# Patient Record
Sex: Female | Born: 1970 | Race: Black or African American | Hispanic: No | Marital: Married | Smoking: Never smoker
Health system: Southern US, Community
[De-identification: ages and names within clinical notes are randomized; demographics above are authoritative.]

## PROBLEM LIST (undated history)

## (undated) DIAGNOSIS — D649 Anemia, unspecified: Secondary | ICD-10-CM

## (undated) DIAGNOSIS — R112 Nausea with vomiting, unspecified: Secondary | ICD-10-CM

## (undated) DIAGNOSIS — Z9889 Other specified postprocedural states: Secondary | ICD-10-CM

## (undated) HISTORY — PX: TOOTH EXTRACTION: SUR596

## (undated) HISTORY — PX: ABDOMINAL HYSTERECTOMY: SHX81

## (undated) HISTORY — PX: TUBAL LIGATION: SHX77

---

## 2016-07-25 ENCOUNTER — Emergency Department (HOSPITAL_BASED_OUTPATIENT_CLINIC_OR_DEPARTMENT_OTHER)
Admission: EM | Admit: 2016-07-25 | Discharge: 2016-07-25 | Disposition: A | Discharge status: Home / Self Care | Payer: BC Managed Care – PPO | Attending: Emergency Medicine | Admitting: Emergency Medicine

## 2016-07-25 ENCOUNTER — Emergency Department (HOSPITAL_BASED_OUTPATIENT_CLINIC_OR_DEPARTMENT_OTHER): Payer: BC Managed Care – PPO

## 2016-07-25 ENCOUNTER — Encounter (HOSPITAL_BASED_OUTPATIENT_CLINIC_OR_DEPARTMENT_OTHER): Payer: Self-pay | Admitting: *Deleted

## 2016-07-25 DIAGNOSIS — M62838 Other muscle spasm: Secondary | ICD-10-CM | POA: Diagnosis not present

## 2016-07-25 DIAGNOSIS — M7918 Myalgia, other site: Secondary | ICD-10-CM

## 2016-07-25 DIAGNOSIS — M542 Cervicalgia: Secondary | ICD-10-CM | POA: Diagnosis present

## 2016-07-25 MED ORDER — MELOXICAM 15 MG PO TABS: 15.0000 mg | ORAL_TABLET | Freq: Every day | ORAL | 0 refills | Status: DC

## 2016-07-25 MED ORDER — METHOCARBAMOL 500 MG PO TABS: 500.0000 mg | ORAL_TABLET | Freq: Two times a day (BID) | ORAL | 0 refills | Status: DC

## 2016-07-25 NOTE — ED Triage Notes (Signed)
Pt c/o left shoulder pain and neck pan x 1 week , seen by PMD xray neg finished decadron today, no improvement

## 2016-07-25 NOTE — ED Provider Notes (Signed)
[facility] Provider Note   CSN: [id number] Arrival date & time: 07/25/16  2247  By signing my name below, I, Margit Banda, attest that this documentation has been prepared under the direction and in the presence of Lexis Potenza, Gwenyth Allegra, MD. Electronically Signed: Margit Banda, ED Scribe. 07/25/16. 11:09 PM.  History   Chief Complaint Chief Complaint  Patient presents with  . Neck Pain    HPI [name] is a 46 y.o. female who presents to the Emergency Department complaining of neck pain for the last three weeks. Associated weakness and soreness to touch noted. Pain radiates to her left shoulder and arm. She describes pain as tingling. She recently went to urgent care in [facility] and was given antibiotics for bursitis. She also had an XR which came back negative. Pt denies fever, nausea, and vomiting.   The history is provided by the patient. No language interpreter was used.    History reviewed. No pertinent past medical history.  There are no active problems to display for this patient.   Past Surgical History:  Procedure Laterality Date  . CESAREAN SECTION    . TUBAL LIGATION      OB History    No data available       Home Medications    Prior to Admission medications   Medication Sig Start Date End Date Taking? Authorizing Provider  meloxicam (MOBIC) 15 MG tablet Take 1 tablet (15 mg total) by mouth daily. 07/25/16   Orpah Greek, MD  methocarbamol (ROBAXIN) 500 MG tablet Take 1 tablet (500 mg total) by mouth 2 (two) times daily. 07/25/16   Orpah Greek, MD    Family History No family history on file.  Social History Social History  Substance Use Topics  . Smoking status: Never Smoker  . Smokeless tobacco: Not on file  . Alcohol use No     Allergies   Patient has no known allergies.   Review of Systems Review of Systems  Constitutional: Positive for fever.  Gastrointestinal: Negative for nausea and  vomiting.  Musculoskeletal: Positive for arthralgias and neck pain.  Neurological: Positive for weakness.  All other systems reviewed and are negative.    Physical Exam Updated Vital Signs BP 129/83   Pulse 73   Temp 98.6 F (37 C)   Resp 16   Ht 5' 5"  (1.651 m)   Wt 81.6 kg (180 lb)   LMP 06/25/2016 Comment: ncp  SpO2 100%   BMI 29.95 kg/m   Physical Exam  Constitutional: She is oriented to person, place, and time. She appears well-developed and well-nourished. No distress.  HENT:  Head: Normocephalic and atraumatic.  Right Ear: Hearing normal.  Left Ear: Hearing normal.  Nose: Nose normal.  Mouth/Throat: Oropharynx is clear and moist and mucous membranes are normal.  Eyes: Conjunctivae and EOM are normal. Pupils are equal, round, and reactive to light.  Neck: Normal range of motion. Neck supple.  Left paraspinal tenderness and spasms. Diffuse left shoulder tenderness.   Cardiovascular: Regular rhythm, S1 normal and S2 normal.  Exam reveals no gallop and no friction rub.   No murmur heard. Pulmonary/Chest: Effort normal and breath sounds normal. No respiratory distress. She exhibits no tenderness.  Abdominal: Soft. Normal appearance and bowel sounds are normal. There is no hepatosplenomegaly. There is no tenderness. There is no rebound, no guarding, no tenderness at McBurney's point and negative Murphy's sign. No hernia.  Musculoskeletal: Normal range of motion.  Neurological: She is alert  and oriented to person, place, and time. She has normal strength. No cranial nerve deficit or sensory deficit. Coordination normal. GCS eye subscore is 4. GCS verbal subscore is 5. GCS motor subscore is 6.  Very slightly decreased grip strength L verses R.  Skin: Skin is warm, dry and intact. No rash noted. No cyanosis.  Psychiatric: She has a normal mood and affect. Her speech is normal and behavior is normal. Thought content normal.  Nursing note and vitals reviewed.    ED Treatments  / Results  DIAGNOSTIC STUDIES: Oxygen Saturation is 100% on RA, normal by my interpretation.   COORDINATION OF CARE: 11:09 PM-Discussed next steps with pt. Pt verbalized understanding and is agreeable with the plan.   Labs (all labs ordered are listed, but only abnormal results are displayed) Labs Reviewed - No data to display  EKG  EKG Interpretation None       Radiology Ct Cervical Spine Wo Contrast  Result Date: 07/25/2016 CLINICAL DATA:  Subacute onset of neck, left shoulder and arm pain. Initial encounter. EXAM: CT CERVICAL SPINE WITHOUT CONTRAST TECHNIQUE: Multidetector CT imaging of the cervical spine was performed without intravenous contrast. Multiplanar CT image reconstructions were also generated. COMPARISON:  None. FINDINGS: Alignment: Normal. Skull base and vertebrae: No acute fracture. No primary bone lesion or focal pathologic process. Soft tissues and spinal canal: No prevertebral fluid or swelling. No visible canal hematoma. Disc levels: Intervertebral disc spaces are preserved. Scattered small anterior and posterior disc osteophyte complexes are seen at the lower cervical spine. Upper chest: The minimally visualized lung apices are clear. The thyroid gland is grossly unremarkable. Other: No additional soft tissue abnormalities are seen. The visualized portions of the brain are unremarkable. IMPRESSION: No fracture or subluxation along the cervical spine. Electronically Signed   By: Garald Balding M.D.   On: 07/25/2016 23:48    Procedures Procedures (including critical care time)  Medications Ordered in ED Medications - No data to display   Initial Impression / Assessment and Plan / ED Course  I have reviewed the triage vital signs and the nursing notes.  Pertinent labs & imaging results that were available during my care of the patient were reviewed by me and considered in my medical decision making (see chart for details).     Patient presents with  complaints of pain in the neck, left shoulder and left arm. She reports that it started 3 weeks ago and has progressively worsened. Patient reports some pain with moving her head from side to side, pain into the left shoulder and arm with tingling. She had very slight decreased grip strength, but likely painful inhibition rather than deficit. She has preserved sensation and motor function/strength across all 3 nerves in the hand. CT scan of cervical spine performed. No obvious abnormality noted. Will require anti-inflammatory medication, muscle relaxer, follow-up.  Final Clinical Impressions(s) / ED Diagnoses   Final diagnoses:  Muscle spasms of neck  Musculoskeletal pain    New Prescriptions New Prescriptions   MELOXICAM (MOBIC) 15 MG TABLET    Take 1 tablet (15 mg total) by mouth daily.   METHOCARBAMOL (ROBAXIN) 500 MG TABLET    Take 1 tablet (500 mg total) by mouth 2 (two) times daily.     Orpah Greek, MD 07/25/16 [phone]

## 2016-07-30 ENCOUNTER — Ambulatory Visit: Payer: BC Managed Care – PPO | Admitting: Family Medicine

## 2016-08-01 ENCOUNTER — Encounter: Payer: Self-pay | Admitting: Family Medicine

## 2016-08-01 ENCOUNTER — Ambulatory Visit (INDEPENDENT_AMBULATORY_CARE_PROVIDER_SITE_OTHER): Payer: BC Managed Care – PPO | Admitting: Family Medicine

## 2016-08-01 DIAGNOSIS — M5010 Cervical disc disorder with radiculopathy, unspecified cervical region: Secondary | ICD-10-CM | POA: Diagnosis not present

## 2016-08-01 MED ORDER — PREDNISONE 10 MG PO TABS: ORAL_TABLET | ORAL | 0 refills | Status: DC

## 2016-08-01 MED ORDER — HYDROCODONE-ACETAMINOPHEN 5-325 MG PO TABS: 1.0000 | ORAL_TABLET | Freq: Four times a day (QID) | ORAL | 0 refills | Status: DC | PRN

## 2016-08-01 NOTE — Patient Instructions (Signed)
You have cervical radiculopathy (a pinched nerve in the neck). Prednisone 12 day dose pack to relieve irritation/inflammation of the nerve. Aleve 2 tabs twice a day with food for pain and inflammation - start day AFTER finishing prednisone. Norco as needed for severe pain (no driving on this medicine). Consider cervical collar if severely painful. Simple range of motion exercises within limits of pain to prevent further stiffness. Consider physical therapy for stretching, exercises, traction, and modalities. Heat 15 minutes at a time 3-4 times a day to help with spasms. Watch head position when on computers, texting, when sleeping in bed - should in line with back to prevent further nerve traction and irritation. Consider home traction unit if you get benefit with this in physical therapy. We will get approval for an MRI and can tentatively set this up for after the 29th but if you're doing well you should cancel this.

## 2016-08-06 DIAGNOSIS — M5010 Cervical disc disorder with radiculopathy, unspecified cervical region: Secondary | ICD-10-CM | POA: Insufficient documentation

## 2016-08-06 NOTE — Progress Notes (Addendum)
PCP: Patient, No Pcp Per  Subjective:   HPI: Patient is a 46 y.o. female here for neck, left shoulder pain.  Patient reports for about 3 weeks she's had pain in left side of neck and shoulder. Was achy in shoulder before started radiating down into the arm. Worse with typing - sits at computer all day. Into all fingers. Pain at neck 2/10, up to 4/10 in shoulder area and sharp now. Tried mobic, robaxin, a steroid for 6-7 days. Especially worse the past week. Associated numbness. Arm feels weak. No skin changes.  No past medical history on file.  Current Outpatient Prescriptions on File Prior to Visit  Medication Sig Dispense Refill  . meloxicam (MOBIC) 15 MG tablet Take 1 tablet (15 mg total) by mouth daily. 20 tablet 0  . methocarbamol (ROBAXIN) 500 MG tablet Take 1 tablet (500 mg total) by mouth 2 (two) times daily. 20 tablet 0   No current facility-administered medications on file prior to visit.     Past Surgical History:  Procedure Laterality Date  . CESAREAN SECTION    . TUBAL LIGATION      No Known Allergies  Social History   Social History  . Marital status: Married    Spouse name: N/A  . Number of children: N/A  . Years of education: N/A   Occupational History  . Not on file.   Social History Main Topics  . Smoking status: Never Smoker  . Smokeless tobacco: Never Used  . Alcohol use No  . Drug use: No  . Sexual activity: Not on file   Other Topics Concern  . Not on file   Social History Narrative  . No narrative on file    No family history on file.  BP 134/76   Pulse 78   Ht 5' 5"  (1.651 m)   Wt 182 lb (82.6 kg)   BMI 30.29 kg/m   Review of Systems: See HPI above.     Objective:  Physical Exam:  Gen: NAD, comfortable in exam room  Neck: No gross deformity, swelling, bruising. TTP left cervical paraspinal region.  No midline/bony TTP. FROM neck - pain right lateral rotation. BUE strength 5/5.   Sensation intact to light touch  currently 1+ equal reflexes in triceps, biceps, brachioradialis tendons. Negative spurlings.  Left shoulder: No swelling, ecchymoses.  No gross deformity. No TTP. FROM. Negative Hawkins, Neers. Negative Yergasons. Strength 5/5 with empty can and resisted internal/external rotation. Negative apprehension. NV intact distally.  Right shoulder: FROM without pain.   Assessment & Plan:  1. Cervical radiculopathy - Patient unfortunately not improving to date with 6 day course of prednisone, mobic, robaxin.  Will set up MRI to assess.  Prednisone 12 day dose pack, norco if needed for severe pain in meantime.  Discussed ergonomic issues.    Addendum:  MRI reviewed and discussed with patient.  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  Moderate stenosis on left at C5-6.  Discussed options - she has improved some since last visit.  She would like to start physical therapy.  Consider ESI if not improving.  F/u in 5-6 weeks if doing well.

## 2016-08-06 NOTE — Assessment & Plan Note (Signed)
Patient unfortunately not improving to date with 6 day course of prednisone, mobic, robaxin.  Will set up MRI to assess.  Prednisone 12 day dose pack, norco if needed for severe pain in meantime.  Discussed ergonomic issues.

## 2016-08-07 NOTE — Addendum Note (Signed)
Addended by: Sherrie George F on: 08/07/2016 09:08 AM   Modules accepted: Orders

## 2016-08-14 ENCOUNTER — Other Ambulatory Visit: Payer: Self-pay | Admitting: Family Medicine

## 2016-09-26 ENCOUNTER — Encounter: Payer: Self-pay | Admitting: Family Medicine

## 2017-04-25 ENCOUNTER — Other Ambulatory Visit: Payer: Self-pay | Admitting: Obstetrics and Gynecology

## 2017-04-25 ENCOUNTER — Other Ambulatory Visit (HOSPITAL_COMMUNITY)
Admission: RE | Admit: 2017-04-25 | Discharge: 2017-04-25 | Disposition: A | Discharge status: Home / Self Care | Payer: BC Managed Care – PPO | Source: Ambulatory Visit | Attending: Obstetrics and Gynecology | Admitting: Obstetrics and Gynecology

## 2017-04-25 DIAGNOSIS — Z01411 Encounter for gynecological examination (general) (routine) with abnormal findings: Secondary | ICD-10-CM | POA: Insufficient documentation

## 2017-04-25 DIAGNOSIS — Z1231 Encounter for screening mammogram for malignant neoplasm of breast: Secondary | ICD-10-CM

## 2017-04-29 LAB — CYTOLOGY - PAP
DIAGNOSIS: NEGATIVE
HPV (WINDOPATH): NOT DETECTED

## 2017-05-27 ENCOUNTER — Ambulatory Visit
Admission: RE | Admit: 2017-05-27 | Discharge: 2017-05-27 | Disposition: A | Discharge status: Home / Self Care | Payer: BC Managed Care – PPO | Source: Ambulatory Visit | Attending: Obstetrics and Gynecology | Admitting: Obstetrics and Gynecology

## 2017-05-27 DIAGNOSIS — Z1231 Encounter for screening mammogram for malignant neoplasm of breast: Secondary | ICD-10-CM

## 2017-05-28 ENCOUNTER — Other Ambulatory Visit: Payer: Self-pay | Admitting: Obstetrics and Gynecology

## 2017-05-28 DIAGNOSIS — R928 Other abnormal and inconclusive findings on diagnostic imaging of breast: Secondary | ICD-10-CM

## 2017-05-31 ENCOUNTER — Ambulatory Visit: Payer: BC Managed Care – PPO

## 2017-05-31 ENCOUNTER — Ambulatory Visit
Admission: RE | Admit: 2017-05-31 | Discharge: 2017-05-31 | Disposition: A | Discharge status: Home / Self Care | Payer: BC Managed Care – PPO | Source: Ambulatory Visit | Attending: Obstetrics and Gynecology | Admitting: Obstetrics and Gynecology

## 2017-05-31 DIAGNOSIS — R928 Other abnormal and inconclusive findings on diagnostic imaging of breast: Secondary | ICD-10-CM

## 2018-01-15 NOTE — Patient Instructions (Addendum)
Your procedure is scheduled on: Wednesday, 12/18  Enter through the Main Entrance of [facility] at: 7 am  Pick up the phone at the desk and dial [phone].  Call this number if you have problems the morning of surgery: [phone].  Remember: Do NOT eat food or Do NOT drink clear liquids (including water) after midnight Tuesday.  Take these medicines the morning of surgery with a SIP OF WATER:  None  Brush your teeth on the day of surgery.  Stop herbal medications, vitamin supplements, Ibuprofen/NSAIDS at this time.  Do NOT wear jewelry (body piercing), metal hair clips/bobby pins, make-up, or nail polish. Do NOT wear lotions, powders, or perfumes.  You may wear deoderant. Do NOT shave for 48 hours prior to surgery. Do NOT bring valuables to the hospital. Contacts may not be worn into surgery.  Leave suitcase in car.  After surgery it may be brought to your room.  For patients admitted to the hospital, checkout time is 11:00 AM the day of discharge. Have a responsible adult drive you home and stay with you for 24 hours after your procedure.  Home with Husband [name] cell [phone].

## 2018-01-24 ENCOUNTER — Other Ambulatory Visit: Payer: Self-pay

## 2018-01-24 ENCOUNTER — Encounter (HOSPITAL_COMMUNITY): Payer: Self-pay

## 2018-01-24 ENCOUNTER — Encounter (HOSPITAL_COMMUNITY)
Admission: RE | Admit: 2018-01-24 | Discharge: 2018-01-24 | Disposition: A | Discharge status: Home / Self Care | Payer: BC Managed Care – PPO | Source: Ambulatory Visit | Attending: Obstetrics and Gynecology | Admitting: Obstetrics and Gynecology

## 2018-01-24 DIAGNOSIS — Z01812 Encounter for preprocedural laboratory examination: Secondary | ICD-10-CM | POA: Insufficient documentation

## 2018-01-24 HISTORY — DX: Anemia, unspecified: D64.9

## 2018-01-24 HISTORY — DX: Other specified postprocedural states: Z98.890

## 2018-01-24 HISTORY — DX: Other specified postprocedural states: R11.2

## 2018-01-24 LAB — TYPE AND SCREEN
ABO/RH(D): B POS
Antibody Screen: NEGATIVE

## 2018-01-24 LAB — ABO/RH: ABO/RH(D): B POS

## 2018-01-24 LAB — CBC
HCT: 32 % — ABNORMAL LOW (ref 36.0–46.0)
HEMOGLOBIN: 10.2 g/dL — AB (ref 12.0–15.0)
MCH: 26.9 pg (ref 26.0–34.0)
MCHC: 31.9 g/dL (ref 30.0–36.0)
MCV: 84.4 fL (ref 80.0–100.0)
Platelets: 321 10*3/uL (ref 150–400)
RBC: 3.79 MIL/uL — AB (ref 3.87–5.11)
RDW: 18.3 % — ABNORMAL HIGH (ref 11.5–15.5)
WBC: 7.6 10*3/uL (ref 4.0–10.5)
nRBC: 0 % (ref 0.0–0.2)

## 2018-01-28 ENCOUNTER — Encounter (HOSPITAL_COMMUNITY): Payer: Self-pay | Admitting: Anesthesiology

## 2018-01-28 NOTE — Anesthesia Preprocedure Evaluation (Addendum)
Anesthesia Evaluation  Patient identified by MRN, date of birth, ID band Patient awake    Reviewed: Allergy & Precautions, NPO status , Patient's Chart, lab work & pertinent test results  History of Anesthesia Complications (+) PONV and history of anesthetic complications  Airway Mallampati: II  TM Distance: >3 FB Neck ROM: Full    Dental no notable dental hx. (+) Teeth Intact   Pulmonary neg pulmonary ROS,    Pulmonary exam normal breath sounds clear to auscultation       Cardiovascular negative cardio ROS Normal cardiovascular exam Rhythm:Regular Rate:Normal     Neuro/Psych  Neuromuscular disease negative psych ROS   GI/Hepatic negative GI ROS, Neg liver ROS,   Endo/Other  negative endocrine ROS  Renal/GU negative Renal ROS  negative genitourinary   Musculoskeletal DDD cervical spine    Abdominal   Peds  Hematology  (+) anemia ,   Anesthesia Other Findings   Reproductive/Obstetrics Uterine fibroids                            Anesthesia Physical Anesthesia Plan  ASA: II  Anesthesia Plan: General   Post-op Pain Management:    Induction: Intravenous  PONV Risk Score and Plan: 4 or greater and Ondansetron, Treatment may vary due to age or medical condition, Scopolamine patch - Pre-op, Midazolam and Dexamethasone  Airway Management Planned: Oral ETT  Additional Equipment:   Intra-op Plan:   Post-operative Plan: Extubation in OR  Informed Consent: I have reviewed the patients History and Physical, chart, labs and discussed the procedure including the risks, benefits and alternatives for the proposed anesthesia with the patient or authorized representative who has indicated his/her understanding and acceptance.   Dental advisory given  Plan Discussed with: CRNA and Surgeon  Anesthesia Plan Comments:        Anesthesia Quick Evaluation

## 2018-01-29 ENCOUNTER — Inpatient Hospital Stay (HOSPITAL_COMMUNITY)
Admission: RE | Admit: 2018-01-29 | Discharge: 2018-01-31 | DRG: 743 | Disposition: A | Discharge status: Home / Self Care | Payer: BC Managed Care – PPO | Attending: Obstetrics and Gynecology | Admitting: Obstetrics and Gynecology

## 2018-01-29 ENCOUNTER — Inpatient Hospital Stay (HOSPITAL_COMMUNITY): Payer: BC Managed Care – PPO | Admitting: Anesthesiology

## 2018-01-29 ENCOUNTER — Encounter (HOSPITAL_COMMUNITY): Payer: Self-pay

## 2018-01-29 ENCOUNTER — Other Ambulatory Visit: Payer: Self-pay

## 2018-01-29 ENCOUNTER — Encounter (HOSPITAL_COMMUNITY)
Admission: RE | Disposition: A | Discharge status: Home / Self Care | Payer: Self-pay | Source: Home / Self Care | Attending: Obstetrics and Gynecology

## 2018-01-29 DIAGNOSIS — Z9079 Acquired absence of other genital organ(s): Secondary | ICD-10-CM

## 2018-01-29 DIAGNOSIS — D259 Leiomyoma of uterus, unspecified: Secondary | ICD-10-CM | POA: Diagnosis present

## 2018-01-29 DIAGNOSIS — Z90722 Acquired absence of ovaries, bilateral: Secondary | ICD-10-CM

## 2018-01-29 DIAGNOSIS — N920 Excessive and frequent menstruation with regular cycle: Secondary | ICD-10-CM | POA: Diagnosis present

## 2018-01-29 DIAGNOSIS — D509 Iron deficiency anemia, unspecified: Secondary | ICD-10-CM | POA: Diagnosis present

## 2018-01-29 DIAGNOSIS — D219 Benign neoplasm of connective and other soft tissue, unspecified: Secondary | ICD-10-CM | POA: Diagnosis present

## 2018-01-29 DIAGNOSIS — Z90710 Acquired absence of both cervix and uterus: Secondary | ICD-10-CM

## 2018-01-29 HISTORY — PX: CYSTOSCOPY: SHX5120

## 2018-01-29 HISTORY — PX: LYSIS OF ADHESION: SHX5961

## 2018-01-29 HISTORY — PX: HYSTERECTOMY ABDOMINAL WITH SALPINGECTOMY: SHX6725

## 2018-01-29 LAB — PREGNANCY, URINE: Preg Test, Ur: NEGATIVE

## 2018-01-29 SURGERY — HYSTERECTOMY, TOTAL, ABDOMINAL, WITH SALPINGECTOMY
Anesthesia: General | Site: Bladder

## 2018-01-29 MED ORDER — MENTHOL 3 MG MT LOZG: 1.0000 | LOZENGE | OROMUCOSAL | Status: DC | PRN

## 2018-01-29 MED ORDER — SODIUM CHLORIDE (PF) 0.9 % IJ SOLN
INTRAMUSCULAR | Status: AC
  Filled 2018-01-29: qty 50

## 2018-01-29 MED ORDER — PROPOFOL 10 MG/ML IV BOLUS
INTRAVENOUS | Status: AC
  Filled 2018-01-29: qty 20

## 2018-01-29 MED ORDER — PROPOFOL 10 MG/ML IV BOLUS
INTRAVENOUS | Status: DC | PRN
  Administered 2018-01-29: 180 mg via INTRAVENOUS

## 2018-01-29 MED ORDER — METHYLENE BLUE 0.5 % INJ SOLN
INTRAVENOUS | Status: DC | PRN
  Administered 2018-01-29: 2 mL via INTRAVENOUS

## 2018-01-29 MED ORDER — BUPIVACAINE HCL (PF) 0.25 % IJ SOLN
INTRAMUSCULAR | Status: AC
  Filled 2018-01-29: qty 30

## 2018-01-29 MED ORDER — MEPERIDINE HCL 25 MG/ML IJ SOLN: 6.2500 mg | INTRAMUSCULAR | Status: DC | PRN

## 2018-01-29 MED ORDER — KETAMINE HCL 10 MG/ML IJ SOLN
INTRAMUSCULAR | Status: DC | PRN
  Administered 2018-01-29: 40 mg via INTRAVENOUS

## 2018-01-29 MED ORDER — CEFAZOLIN SODIUM-DEXTROSE 2-4 GM/100ML-% IV SOLN
INTRAVENOUS | Status: AC
  Filled 2018-01-29: qty 100

## 2018-01-29 MED ORDER — ROCURONIUM BROMIDE 100 MG/10ML IV SOLN
INTRAVENOUS | Status: AC
  Filled 2018-01-29: qty 1

## 2018-01-29 MED ORDER — LIDOCAINE HCL (CARDIAC) PF 100 MG/5ML IV SOSY
PREFILLED_SYRINGE | INTRAVENOUS | Status: DC | PRN
  Administered 2018-01-29: 80 mg via INTRAVENOUS

## 2018-01-29 MED ORDER — DEXAMETHASONE SODIUM PHOSPHATE 4 MG/ML IJ SOLN
INTRAMUSCULAR | Status: DC | PRN
  Administered 2018-01-29: 8 mg via INTRAVENOUS

## 2018-01-29 MED ORDER — METOCLOPRAMIDE HCL 5 MG/ML IJ SOLN
10.0000 mg | Freq: Once | INTRAMUSCULAR | Status: AC | PRN
  Administered 2018-01-29: 10 mg via INTRAVENOUS

## 2018-01-29 MED ORDER — ONDANSETRON HCL 4 MG/2ML IJ SOLN
INTRAMUSCULAR | Status: DC | PRN
  Administered 2018-01-29: 4 mg via INTRAVENOUS

## 2018-01-29 MED ORDER — VASOPRESSIN 20 UNIT/ML IV SOLN
INTRAVENOUS | Status: AC
  Filled 2018-01-29: qty 1

## 2018-01-29 MED ORDER — ROCURONIUM BROMIDE 100 MG/10ML IV SOLN
INTRAVENOUS | Status: DC | PRN
  Administered 2018-01-29: 10 mg via INTRAVENOUS
  Administered 2018-01-29: 50 mg via INTRAVENOUS
  Administered 2018-01-29: 20 mg via INTRAVENOUS

## 2018-01-29 MED ORDER — BUPIVACAINE LIPOSOME 1.3 % IJ SUSP
20.0000 mL | Freq: Once | INTRAMUSCULAR | Status: DC
  Filled 2018-01-29: qty 20

## 2018-01-29 MED ORDER — HYDROMORPHONE HCL 1 MG/ML IJ SOLN
INTRAMUSCULAR | Status: AC
  Filled 2018-01-29: qty 0.5

## 2018-01-29 MED ORDER — SODIUM CHLORIDE 0.9 % IV SOLN
INTRAVENOUS | Status: DC | PRN
  Administered 2018-01-29: 25 ug/min via INTRAVENOUS

## 2018-01-29 MED ORDER — HYDROMORPHONE HCL 1 MG/ML IJ SOLN
0.2500 mg | INTRAMUSCULAR | Status: DC | PRN
  Administered 2018-01-29 (×4): 0.5 mg via INTRAVENOUS

## 2018-01-29 MED ORDER — CIPROFLOXACIN HCL 250 MG PO TABS
250.0000 mg | ORAL_TABLET | Freq: Two times a day (BID) | ORAL | Status: AC
  Administered 2018-01-29 – 2018-01-30 (×2): 250 mg via ORAL
  Filled 2018-01-29 (×2): qty 1

## 2018-01-29 MED ORDER — ONDANSETRON HCL 4 MG PO TABS: 4.0000 mg | ORAL_TABLET | Freq: Four times a day (QID) | ORAL | Status: DC | PRN

## 2018-01-29 MED ORDER — KETOROLAC TROMETHAMINE 30 MG/ML IJ SOLN
INTRAMUSCULAR | Status: DC | PRN
  Administered 2018-01-29: 30 mg via INTRAVENOUS

## 2018-01-29 MED ORDER — KETOROLAC TROMETHAMINE 30 MG/ML IJ SOLN
30.0000 mg | Freq: Four times a day (QID) | INTRAMUSCULAR | Status: AC
  Administered 2018-01-29 – 2018-01-30 (×4): 30 mg via INTRAVENOUS
  Filled 2018-01-29 (×4): qty 1

## 2018-01-29 MED ORDER — DOCUSATE SODIUM 100 MG PO CAPS
100.0000 mg | ORAL_CAPSULE | Freq: Two times a day (BID) | ORAL | Status: DC
  Administered 2018-01-30 – 2018-01-31 (×4): 100 mg via ORAL
  Filled 2018-01-29 (×5): qty 1

## 2018-01-29 MED ORDER — POLYSACCHARIDE IRON COMPLEX 150 MG PO CAPS
150.0000 mg | ORAL_CAPSULE | Freq: Every day | ORAL | Status: DC
  Administered 2018-01-30 – 2018-01-31 (×2): 150 mg via ORAL
  Filled 2018-01-29 (×3): qty 1

## 2018-01-29 MED ORDER — ONDANSETRON HCL 4 MG/2ML IJ SOLN
INTRAMUSCULAR | Status: AC
  Filled 2018-01-29: qty 2

## 2018-01-29 MED ORDER — EPHEDRINE SULFATE-NACL 50-0.9 MG/10ML-% IV SOSY
PREFILLED_SYRINGE | INTRAVENOUS | Status: DC | PRN
  Administered 2018-01-29 (×2): 5 mg via INTRAVENOUS
  Administered 2018-01-29: 10 mg via INTRAVENOUS
  Administered 2018-01-29: 5 mg via INTRAVENOUS

## 2018-01-29 MED ORDER — BUPIVACAINE HCL (PF) 0.25 % IJ SOLN
INTRAMUSCULAR | Status: AC
  Filled 2018-01-29: qty 10

## 2018-01-29 MED ORDER — KETOROLAC TROMETHAMINE 30 MG/ML IJ SOLN
INTRAMUSCULAR | Status: AC
  Filled 2018-01-29: qty 1

## 2018-01-29 MED ORDER — ONDANSETRON HCL 4 MG/2ML IJ SOLN
4.0000 mg | Freq: Four times a day (QID) | INTRAMUSCULAR | Status: DC | PRN
  Administered 2018-01-30: 4 mg via INTRAVENOUS
  Filled 2018-01-29: qty 2

## 2018-01-29 MED ORDER — OXYCODONE HCL 5 MG PO TABS
5.0000 mg | ORAL_TABLET | ORAL | Status: DC | PRN
  Administered 2018-01-30: 5 mg via ORAL
  Administered 2018-01-30: 10 mg via ORAL
  Administered 2018-01-30 – 2018-01-31 (×2): 5 mg via ORAL
  Filled 2018-01-29: qty 2
  Filled 2018-01-29: qty 1
  Filled 2018-01-29: qty 2
  Filled 2018-01-29 (×2): qty 1

## 2018-01-29 MED ORDER — CEFAZOLIN SODIUM-DEXTROSE 2-4 GM/100ML-% IV SOLN
2.0000 g | INTRAVENOUS | Status: AC
  Administered 2018-01-29: 2 g via INTRAVENOUS

## 2018-01-29 MED ORDER — LACTATED RINGERS IV SOLN
INTRAVENOUS | Status: DC
  Administered 2018-01-29: 09:00:00 via INTRAVENOUS
  Administered 2018-01-29: 125 mL/h via INTRAVENOUS
  Administered 2018-01-29: 11:00:00 via INTRAVENOUS

## 2018-01-29 MED ORDER — MIDAZOLAM HCL 2 MG/2ML IJ SOLN
INTRAMUSCULAR | Status: AC
  Filled 2018-01-29: qty 2

## 2018-01-29 MED ORDER — SUGAMMADEX SODIUM 200 MG/2ML IV SOLN
INTRAVENOUS | Status: DC | PRN
  Administered 2018-01-29: 160 mg via INTRAVENOUS

## 2018-01-29 MED ORDER — HYDROMORPHONE HCL 1 MG/ML IJ SOLN
0.2000 mg | INTRAMUSCULAR | Status: DC | PRN
  Administered 2018-01-29 (×3): 0.6 mg via INTRAVENOUS
  Filled 2018-01-29 (×3): qty 1

## 2018-01-29 MED ORDER — ACETAMINOPHEN 10 MG/ML IV SOLN
1000.0000 mg | Freq: Once | INTRAVENOUS | Status: AC
  Administered 2018-01-29: 1000 mg via INTRAVENOUS

## 2018-01-29 MED ORDER — FENTANYL CITRATE (PF) 250 MCG/5ML IJ SOLN
INTRAMUSCULAR | Status: AC
  Filled 2018-01-29: qty 5

## 2018-01-29 MED ORDER — SCOPOLAMINE 1 MG/3DAYS TD PT72
1.0000 | MEDICATED_PATCH | Freq: Once | TRANSDERMAL | Status: DC
  Administered 2018-01-29: 1.5 mg via TRANSDERMAL

## 2018-01-29 MED ORDER — FENTANYL CITRATE (PF) 250 MCG/5ML IJ SOLN
INTRAMUSCULAR | Status: DC | PRN
  Administered 2018-01-29: 50 ug via INTRAVENOUS
  Administered 2018-01-29: 100 ug via INTRAVENOUS
  Administered 2018-01-29 (×2): 50 ug via INTRAVENOUS

## 2018-01-29 MED ORDER — SUGAMMADEX SODIUM 200 MG/2ML IV SOLN
INTRAVENOUS | Status: AC
  Filled 2018-01-29: qty 2

## 2018-01-29 MED ORDER — ACETAMINOPHEN 500 MG PO TABS
1000.0000 mg | ORAL_TABLET | Freq: Four times a day (QID) | ORAL | Status: DC
  Administered 2018-01-29 – 2018-01-30 (×3): 1000 mg via ORAL
  Filled 2018-01-29 (×4): qty 2

## 2018-01-29 MED ORDER — LIDOCAINE HCL (CARDIAC) PF 100 MG/5ML IV SOSY
PREFILLED_SYRINGE | INTRAVENOUS | Status: AC
  Filled 2018-01-29: qty 5

## 2018-01-29 MED ORDER — EPHEDRINE 5 MG/ML INJ
INTRAVENOUS | Status: AC
  Filled 2018-01-29: qty 10

## 2018-01-29 MED ORDER — MIDAZOLAM HCL 2 MG/2ML IJ SOLN
INTRAMUSCULAR | Status: DC | PRN
  Administered 2018-01-29: 2 mg via INTRAVENOUS

## 2018-01-29 MED ORDER — BUPIVACAINE LIPOSOME 1.3 % IJ SUSP
INTRAMUSCULAR | Status: DC | PRN
  Administered 2018-01-29: 20 mL

## 2018-01-29 MED ORDER — KETAMINE HCL 10 MG/ML IJ SOLN
INTRAMUSCULAR | Status: AC
  Filled 2018-01-29: qty 1

## 2018-01-29 MED ORDER — INTEGRA 62.5-62.5-40-3 MG PO CAPS: 1.0000 | ORAL_CAPSULE | Freq: Every day | ORAL | Status: DC

## 2018-01-29 MED ORDER — LACTATED RINGERS IV SOLN
INTRAVENOUS | Status: DC
  Administered 2018-01-29 – 2018-01-30 (×3): via INTRAVENOUS

## 2018-01-29 MED ORDER — LIDOCAINE 2% (20 MG/ML) 5 ML SYRINGE
INTRAMUSCULAR | Status: DC | PRN
  Administered 2018-01-29: 10:00:00 via INTRAVENOUS
  Administered 2018-01-29: 1.5 mg/kg/h via INTRAVENOUS

## 2018-01-29 MED ORDER — METHYLENE BLUE 0.5 % INJ SOLN
INTRAVENOUS | Status: AC
  Filled 2018-01-29: qty 10

## 2018-01-29 MED ORDER — HYDROMORPHONE HCL 1 MG/ML IJ SOLN
INTRAMUSCULAR | Status: AC
  Filled 2018-01-29: qty 1

## 2018-01-29 MED ORDER — METOCLOPRAMIDE HCL 5 MG/ML IJ SOLN
INTRAMUSCULAR | Status: AC
  Filled 2018-01-29: qty 2

## 2018-01-29 MED ORDER — SIMETHICONE 80 MG PO CHEW
80.0000 mg | CHEWABLE_TABLET | Freq: Four times a day (QID) | ORAL | Status: DC | PRN
  Administered 2018-01-30 – 2018-01-31 (×4): 80 mg via ORAL
  Filled 2018-01-29 (×3): qty 1

## 2018-01-29 MED ORDER — DEXAMETHASONE SODIUM PHOSPHATE 10 MG/ML IJ SOLN
INTRAMUSCULAR | Status: AC
  Filled 2018-01-29: qty 1

## 2018-01-29 MED ORDER — BUPIVACAINE HCL (PF) 0.25 % IJ SOLN
INTRAMUSCULAR | Status: DC | PRN
  Administered 2018-01-29: 40 mL

## 2018-01-29 MED ORDER — SCOPOLAMINE 1 MG/3DAYS TD PT72
MEDICATED_PATCH | TRANSDERMAL | Status: AC
  Administered 2018-01-29: 1.5 mg via TRANSDERMAL
  Filled 2018-01-29: qty 1

## 2018-01-29 MED ORDER — ACETAMINOPHEN 10 MG/ML IV SOLN
INTRAVENOUS | Status: AC
  Filled 2018-01-29: qty 100

## 2018-01-29 MED ORDER — VASOPRESSIN 20 UNIT/ML IV SOLN
INTRAVENOUS | Status: DC | PRN
  Administered 2018-01-29: 11 mL via INTRAMUSCULAR

## 2018-01-29 SURGICAL SUPPLY — 44 items
BENZOIN TINCTURE PRP APPL 2/3 (GAUZE/BANDAGES/DRESSINGS) ×5 IMPLANT
CANISTER SUCT 3000ML PPV (MISCELLANEOUS) ×5 IMPLANT
CELLS DAT CNTRL 66122 CELL SVR (MISCELLANEOUS) IMPLANT
CLOSURE WOUND 1/2 X4 (GAUZE/BANDAGES/DRESSINGS) ×1
CONT PATH 16OZ SNAP LID 3702 (MISCELLANEOUS) ×5 IMPLANT
DRAPE WARM FLUID 44X44 (DRAPE) IMPLANT
DRSG OPSITE POSTOP 4X10 (GAUZE/BANDAGES/DRESSINGS) ×5 IMPLANT
DURAPREP 26ML APPLICATOR (WOUND CARE) ×5 IMPLANT
FORMULA 20CAL 3 OZ MEAD (FORMULA) ×5 IMPLANT
GAUZE 4X4 16PLY RFD (DISPOSABLE) ×5 IMPLANT
GLOVE BIO SURGEON STRL SZ7 (GLOVE) ×5 IMPLANT
GLOVE BIOGEL PI IND STRL 7.0 (GLOVE) ×9 IMPLANT
GLOVE BIOGEL PI INDICATOR 7.0 (GLOVE) ×6
GOWN STRL REUS W/TWL LRG LVL3 (GOWN DISPOSABLE) ×15 IMPLANT
HEMOSTAT ARISTA ABSORB 3G PWDR (MISCELLANEOUS) ×5 IMPLANT
HIBICLENS CHG 4% 4OZ BTL (MISCELLANEOUS) ×5 IMPLANT
NDL SAFETY ECLIPSE 18X1.5 (NEEDLE) ×3 IMPLANT
NEEDLE HYPO 18GX1.5 SHARP (NEEDLE) ×2
NEEDLE HYPO 22GX1.5 SAFETY (NEEDLE) ×10 IMPLANT
NS IRRIG 1000ML POUR BTL (IV SOLUTION) ×5 IMPLANT
PACK ABDOMINAL GYN (CUSTOM PROCEDURE TRAY) ×5 IMPLANT
PAD OB MATERNITY 4.3X12.25 (PERSONAL CARE ITEMS) ×5 IMPLANT
PENCIL SMOKE EVAC W/HOLSTER (ELECTROSURGICAL) ×5 IMPLANT
PROTECTOR NERVE ULNAR (MISCELLANEOUS) ×5 IMPLANT
RTRCTR C-SECT PINK 25CM LRG (MISCELLANEOUS) ×5 IMPLANT
RTRCTR WOUND ALEXIS 18CM MED (MISCELLANEOUS)
SET CYSTO W/LG BORE CLAMP LF (SET/KITS/TRAYS/PACK) ×10 IMPLANT
SPONGE LAP 18X18 RF (DISPOSABLE) ×20 IMPLANT
STRIP CLOSURE SKIN 1/2X4 (GAUZE/BANDAGES/DRESSINGS) ×4 IMPLANT
SUT MON AB 4-0 PS1 27 (SUTURE) ×5 IMPLANT
SUT PLAIN 2 0 XLH (SUTURE) ×5 IMPLANT
SUT VIC AB 0 CT1 18XCR BRD8 (SUTURE) ×9 IMPLANT
SUT VIC AB 0 CT1 27 (SUTURE) ×4
SUT VIC AB 0 CT1 27XBRD ANBCTR (SUTURE) ×6 IMPLANT
SUT VIC AB 0 CT1 27XCR 8 STRN (SUTURE) IMPLANT
SUT VIC AB 0 CT1 36 (SUTURE) ×5 IMPLANT
SUT VIC AB 0 CT1 8-18 (SUTURE) ×6
SUT VIC AB 2-0 CT1 27 (SUTURE) ×2
SUT VIC AB 2-0 CT1 TAPERPNT 27 (SUTURE) ×3 IMPLANT
SUT VICRYL 0 TIES 12 18 (SUTURE) ×5 IMPLANT
SYR CONTROL 10ML LL (SYRINGE) ×10 IMPLANT
SYRINGE 60CC LL (MISCELLANEOUS) ×5 IMPLANT
TOWEL OR 17X24 6PK STRL BLUE (TOWEL DISPOSABLE) ×10 IMPLANT
TRAY FOLEY W/BAG SLVR 14FR (SET/KITS/TRAYS/PACK) ×10 IMPLANT

## 2018-01-29 NOTE — Anesthesia Procedure Notes (Signed)
Procedure Name: Intubation Date/Time: 01/29/2018 8:35 AM Performed by: Raenette Rover, CRNA Pre-anesthesia Checklist: Patient identified, Emergency Drugs available, Suction available and Patient being monitored Patient Re-evaluated:Patient Re-evaluated prior to induction Oxygen Delivery Method: Circle system utilized Preoxygenation: Pre-oxygenation with 100% oxygen Induction Type: IV induction Ventilation: Mask ventilation without difficulty Laryngoscope Size: Mac and 3 Grade View: Grade I Tube type: Oral Tube size: 7.0 mm Number of attempts: 1 Airway Equipment and Method: Stylet Placement Confirmation: ETT inserted through vocal cords under direct vision,  positive ETCO2,  CO2 detector and breath sounds checked- equal and bilateral Secured at: 22 cm Tube secured with: Tape Dental Injury: Teeth and Oropharynx as per pre-operative assessment

## 2018-01-29 NOTE — Anesthesia Postprocedure Evaluation (Signed)
Anesthesia Post Note  Patient: [name]  Procedure(s) Performed: HYSTERECTOMY ABDOMINAL WITH SALPINGECTOMY (Bilateral Abdomen) CYSTOSCOPY (N/A Bladder)     Patient location during evaluation: PACU Anesthesia Type: General Level of consciousness: awake and alert and oriented Pain management: pain level controlled Vital Signs Assessment: post-procedure vital signs reviewed and stable Respiratory status: spontaneous breathing, nonlabored ventilation and respiratory function stable Cardiovascular status: blood pressure returned to baseline and stable Postop Assessment: no apparent nausea or vomiting Anesthetic complications: no    Last Vitals:  Vitals:   01/29/18 0708 01/29/18 1210  BP: (!) 153/93 (!) 150/81  Pulse: 67 72  Resp: 16 (!) 21  Temp: 36.8 C 37.3 C  SpO2: 100% 100%    Last Pain:  Vitals:   01/29/18 1210  TempSrc:   PainSc: Asleep   Pain Goal: Patients Stated Pain Goal: 3 (01/29/18 0708)               Aria Pickrell A.

## 2018-01-29 NOTE — H&P (Signed)
History of Present Illness  General:          47 y/o female presents to the clinic for pre-op of TAH.        US Pelvic (05/2017), x12 fibroids, largest 11 cm        Husband will be home to assist her after surgery.        Denies chest pain, SOB, and unusual discharge.         Pt previously had UTI in September.    Current Medications  TakingIntegra 62.5-62.5-40-3 MG Capsule 1 capsule Orally Once a day    Medication List reviewed and reconciled with the patient          Past Medical History       Medical History Verified..       Surgical History  Cesarean 1996  tubal ligation 2001      Family History  Father: alive  Mother: alive, diagnosed with Hypertension  Brother 1: alive  Sister 1: alive  1 brother(s) , 1 sister(s) - healthy.   denies any GYN family cancer hx.      Social History  General:         no Alcohol.        Children: 3.        Tobacco use            cigarettes:  Never smoked          Tobacco history last updated  01/14/2018          Vaping  No       Marital Status: married.        no Recreational drug use.        OCCUPATION: career Actor.        Exercise: NONE.     Gyn History  Sexual activity currently sexually active.   Periods : every month.   LMP 11/11//2019.   Birth control none.   Last pap smear date 04/25/2017 Neg/HPVneg.   Last mammogram date None.   Denies H/O Abnormal pap smear 2012 normal.   Denies H/O STD.      OB History  Number of pregnancies  5.   miscarriages  2.   Pregnancy # 1  C-section delivery.   Pregnancy # 2  C-section.   Pregnancy # 3  C-section.      Allergies  N.K.D.A.      Hospitalization/Major Diagnostic Procedure  None 04/2017      Review of Systems  See scanned ROS form for details. Denies fever/chills, chest pain, SOB, headaches, numbness/tingling. No h/o complication with anesthesia, bleeding disorders or blood clots.    Vital Signs  Wt 181.2, Wt change -3.6 lb, Ht 64.75, BMI 30.38, Temp  98.8, Pulse sitting 78, BP sitting 122/80.    Physical Examination  Chaperone present:          Chaperone present  Tyus,Brendell 01/14/2018 04:31:42 PM > Pt ok w/scribe and, for pelvic exam.   GENERAL:          Patient appears  alert and oriented.          General Appearance:  well-appearing, well-developed, no acute distress.          Speech:  clear.   LUNGS:          Auscultation:  no wheezing/rhonchi/rales. CTA bilaterally.   HEART:          Heart sounds:  normal. RRR. no murmur.   ABDOMEN:  General:  uterus palpated near umblicus, mobile, nontender.   FEMALE GENITOURINARY:          Pelvic  Not examined.   EXTREMITIES:          General:  No edema or calf tenderness.           Assessments  1. Encounter for other preprocedural examination - Z01.818 (Primary)  2. Iron deficiency anemia - D50.9  3. H/O urinary tract infection - Z87.440    Treatment  1. Encounter for other preprocedural examination   Notes: Pt counseled on R/B/A of TAH , including but not limited to infection, bleeding and injury to organs in the abdomen. Pt understands procedure, risks, and precautions. Reviewed increased risk of injury to bowel and bladder due to multiple abdominal surgeries. Pictures shown. Discussed post op recovery.      2. Iron deficiency anemia        LAB: CBC without Diff      LAB: Ferritin   3. H/O urinary tract infection        LAB: Culture if Positive (Urinalysis)      LAB: Urine Dip w/reflex to micro if positive   4. Others         LAB: Urine microscopic (ECL)      LAB: Culture, Urine Routine ([id number])      Procedures  Scribe Documentation:          Attestation:  I personally scribed for Dr. Simona Huh on the date of this appointment. Electronically signed by scribe , Anastasio Auerbach 01/14/2018 05:01:48 PM > .  Venipuncture:          Venipuncture:  Williams,Christina 01/14/2018 05:24:32 PM > performed in right arm.             Labs          Lab: Urine Dip  w/reflex to micro if positive       Specific Gravity 1.010  1.010-1.030 -        pH 7.0  5.0-8.0 -        Leukocyte Esterase 1+ A Negative -        Blood NEGATIVE  Negative - ERY/UL       Glucose NEGATIVE  Negative - mg/dL       Nitrite NEGATIVE  Negative -        Protein NEGATIVE  Negative - mg/dL       Ketones NEGATIVE  Negative - mg/dL       Urine Bilirubin NEGATIVE  Negative -        Urobilinogen 1.0  0.0-1.0 - mg/dL                Notes :Angelise Petrich B 01/16/2018 07:48:40 AM > Still anemic, not much improvment. Pt needs to take iron daily until surgery. Awaiting urine culture. Tyus,Brendell 01/17/2018 08:38:18 AM > Pt informed.                  Lab: Culture if Positive (Urinalysis)       CULTURE IF POS See Culture Report from [place]  -                 Notes :Eugine Bubb B 01/16/2018 07:48:40 AM > Still anemic, not much improvment. Pt needs to take iron daily until surgery. Awaiting urine culture. Tyus,Brendell 01/17/2018 08:38:18 AM > Pt informed.                  Lab: Ferritin   21.4  FERRITIN 21.4  11.0-306.8 - ng/mL                Notes :Renlee Floor B 01/16/2018 07:48:40 AM > Still anemic, not much improvment. Pt needs to take iron daily until surgery. Awaiting urine culture. Tyus,Brendell 01/17/2018 08:38:18 AM > Pt informed.                  Lab: CBC without Diff   Hg 9.3       WBC 9.7  4.0-11.0 - K/ul       RBC 3.50 L 4.20-5.40 - M/uL       HGB 9.3 L 12.0-16.0 - g/dL       HCT 28.8 L 37.0-47.0 - %       MCV 82.4  81.0-99.0 - fL       MCH 26.6 L 27.0-33.0 - pg       MCHC 32.2  32.0-36.0 - g/dL       RDW 17.4 H 11.5-15.5 - %       PLT 255  150-400 - K/uL                Notes :Kuper Rennels B 01/16/2018 07:48:40 AM > Still anemic, not much improvment. Pt needs to take iron daily until surgery. Awaiting urine culture. Tyus,Brendell 01/17/2018 08:38:18 AM > Pt informed.                  Lab: Urine microscopic (ECL)       RBC/hpf None Seen   0-5 - /HPF       WBC/hpf <5/HPF  0-5 - /HPF       Epithelial cells Few A 0-5 - /HPF       Mucus Negative  Negative - /HPF       Bacteria Abundant A Negative - /HPF                Notes :eclinicalworks, support 01/14/2018 06:01:00 : This order was created by the Interface. Shadaya Marschner B 01/16/2018 07:48:40 AM > Still anemic, not much improvment. Pt needs to take iron daily until surgery. Awaiting urine culture. Tyus,Brendell 01/17/2018 08:38:18 AM > Pt informed.                  Lab: Culture, Urine Routine ([id number])       Urine Culture, Routine Final report A -                 Notes :eclinicalworks, support 01/16/2018 01:43:00 : This order was created by the Interface. Tyus,Brendell 01/17/2018 08:38:18 AM > Pt informed.     Follow Up  2 Weeks post op

## 2018-01-29 NOTE — Plan of Care (Signed)
Patient admitted for post op care

## 2018-01-29 NOTE — Brief Op Note (Signed)
01/29/2018  12:15 PM  PATIENT:  [name]  47 y.o. female  PRE-OPERATIVE DIAGNOSIS:  D21.9 Fibroids, menorrhagia  POST-OPERATIVE DIAGNOSIS:  Same, dense adhesions   PROCEDURE:  Procedure(s): HYSTERECTOMY ABDOMINAL WITH SALPINGECTOMY (Bilateral) CYSTOSCOPY (N/A) LYSIS OF ADHESIONS  SURGEON:  Surgeon(s) and Role:    Thurnell Lose, MD - Primary    * Janyth Pupa, DO - Assisting  PHYSICIAN ASSISTANT: None  ASSISTANTS: Dr. Nelda Marseille, Technician   ANESTHESIA:   local and general  EBL:  350 mL   BLOOD ADMINISTERED:none  DRAINS: Urinary Catheter (Foley)   LOCAL MEDICATIONS USED:  OTHER Exparel, Marcaine 0.25% (Mixed, 40 ml total)  SPECIMEN:  Source of Specimen:  Uterus with cervix and bilateral fallopian tubes  DISPOSITION OF SPECIMEN:  PATHOLOGY  COUNTS:  YES  TOURNIQUET:  * No tourniquets in log *  DICTATION: .Other Dictation: Dictation Number [id number]  PLAN OF CARE: Admit to inpatient   PATIENT DISPOSITION:  PACU - hemodynamically stable.   Delay start of Pharmacological VTE agent (>24hrs) due to surgical blood loss or risk of bleeding: yes

## 2018-01-29 NOTE — OR Nursing (Signed)
Family updated on surgery progress per Dr. Simona Huh by phone

## 2018-01-29 NOTE — OR Nursing (Signed)
Family updated by phone. Dr. Simona Huh is closing, will do the cystoscopy and then be out to talk to family. Family will wait in surgical waiting room for Dr. Simona Huh

## 2018-01-29 NOTE — Transfer of Care (Signed)
Immediate Anesthesia Transfer of Care Note  Patient: [name]  Procedure(s) Performed: HYSTERECTOMY ABDOMINAL WITH SALPINGECTOMY (Bilateral Abdomen) CYSTOSCOPY (N/A Bladder)  Patient Location: PACU  Anesthesia Type:General  Level of Consciousness: drowsy and patient cooperative  Airway & Oxygen Therapy: Patient Spontanous Breathing and Patient connected to nasal cannula oxygen  Post-op Assessment: Report given to RN and Post -op Vital signs reviewed and stable  Post vital signs: Reviewed and stable  Last Vitals:  Vitals Value Taken Time  BP 150/81 01/29/2018 12:11 PM  Temp    Pulse 68 01/29/2018 12:14 PM  Resp 22 01/29/2018 12:14 PM  SpO2 100 % 01/29/2018 12:14 PM  Vitals shown include unvalidated device data.  Last Pain:  Vitals:   01/29/18 0708  TempSrc: Oral  PainSc: 0-No pain      Patients Stated Pain Goal: 3 (94/58/59 2924)  Complications: No apparent anesthesia complications

## 2018-01-29 NOTE — Interval H&P Note (Signed)
History and Physical Interval Note:  01/29/2018 8:19 AM  [name]  has presented today for surgery, with the diagnosis of D21.9 Fibroids  The various methods of treatment have been discussed with the patient and family. After consideration of risks, benefits and other options for treatment, the patient has consented to  Procedure(s): HYSTERECTOMY ABDOMINAL WITH SALPINGECTOMY (Bilateral) as a surgical intervention .  The patient's history has been reviewed, patient examined, no change in status, stable for surgery.  I have reviewed the patient's chart and labs.  Questions were answered to the patient's satisfaction.     Thurnell Lose

## 2018-01-30 ENCOUNTER — Encounter (HOSPITAL_COMMUNITY): Payer: Self-pay | Admitting: Obstetrics and Gynecology

## 2018-01-30 LAB — CBC
HEMATOCRIT: 29.4 % — AB (ref 36.0–46.0)
Hemoglobin: 9.6 g/dL — ABNORMAL LOW (ref 12.0–15.0)
MCH: 26.7 pg (ref 26.0–34.0)
MCHC: 32.7 g/dL (ref 30.0–36.0)
MCV: 81.9 fL (ref 80.0–100.0)
Platelets: 336 10*3/uL (ref 150–400)
RBC: 3.59 MIL/uL — ABNORMAL LOW (ref 3.87–5.11)
RDW: 17.9 % — ABNORMAL HIGH (ref 11.5–15.5)
WBC: 16.2 10*3/uL — ABNORMAL HIGH (ref 4.0–10.5)
nRBC: 0 % (ref 0.0–0.2)

## 2018-01-30 MED ORDER — KETOROLAC TROMETHAMINE 10 MG PO TABS
10.0000 mg | ORAL_TABLET | Freq: Four times a day (QID) | ORAL | Status: DC | PRN
  Filled 2018-01-30: qty 1

## 2018-01-30 MED ORDER — HYDROCHLOROTHIAZIDE 12.5 MG PO CAPS
12.5000 mg | ORAL_CAPSULE | Freq: Every day | ORAL | Status: DC
  Administered 2018-01-30 – 2018-01-31 (×2): 12.5 mg via ORAL
  Filled 2018-01-30 (×3): qty 1

## 2018-01-30 MED ORDER — ACETAMINOPHEN 325 MG PO TABS: 650.0000 mg | ORAL_TABLET | Freq: Four times a day (QID) | ORAL | Status: DC | PRN

## 2018-01-30 NOTE — Discharge Instructions (Signed)
Abdominal Hysterectomy, Care After This sheet gives you information about how to care for yourself after your procedure. Your doctor may also give you more specific instructions. If you have problems or questions, contact your doctor. Follow these instructions at home: Bathing  Do not take baths, swim, or use a hot tub until your doctor says it is okay. Ask your doctor if you can take showers. You may only be allowed to take sponge baths for bathing.  Keep the bandage (dressing) dry until your doctor says it can be taken off. Surgical cut (incision) care      Follow instructions from your doctor about how to take care of your cut from surgery. Make sure you: ? Wash your hands with soap and water before you change your bandage (dressing). If you cannot use soap and water, use hand sanitizer. ? Change your bandage as told by your doctor. ? Leave stitches (sutures), skin glue, or skin tape (adhesive) strips in place. They may need to stay in place for 2 weeks or longer. If tape strips get loose and curl up, you may trim the loose edges. Do not remove tape strips completely unless your doctor says it is okay.  Check your surgical cut area every day for signs of infection. Check for: ? Redness, swelling, or pain. ? Fluid or blood. ? Warmth. ? Pus or a bad smell. Activity  Do gentle, daily exercise as told by your doctor. You may be told to take short walks every day and go farther each time.  Do not lift anything that is heavier than 10 lb (4.5 kg), or the limit that your doctor tells you, until he or she says that it is safe.  Do not drive or use heavy machinery while taking prescription pain medicine.  Do not drive for 24 hours if you were given a medicine to help you relax (sedative).  Follow your doctor's advice about exercise, driving, and general activities. Ask your doctor what activities are safe for you. Lifestyle   Do not douche, use tampons, or have sex for at least 6 weeks  or as told by your doctor.  Do not drink alcohol until your doctor says it is okay.  Drink enough fluid to keep your pee (urine) clear or pale yellow.  Try to have someone at home with you for the first 1-2 weeks to help.  Do not use any products that contain nicotine or tobacco, such as cigarettes and e-cigarettes. These can slow down healing. If you need help quitting, ask your doctor. General instructions  Take over-the-counter and prescription medicines only as told by your doctor.  Do not take aspirin or ibuprofen. These medicines can cause bleeding.  To prevent or treat constipation while you are taking prescription pain medicine, your doctor may suggest that you: ? Drink enough fluid to keep your urine clear or pale yellow. ? Take over-the-counter or prescription medicines. ? Eat foods that are high in fiber, such as:  Fresh fruits and vegetables.  Whole grains.  Beans. ? Limit foods that are high in fat and processed sugars, such as fried and sweet foods.  Keep all follow-up visits as told by your doctor. This is important. Contact a doctor if:  You have chills or fever.  You have redness, swelling, or pain around your cut.  You have fluid or blood coming from your cut.  Your cut feels warm to the touch.  You have pus or a bad smell coming from your cut.  Your cut breaks open.  You feel dizzy or light-headed.  You have pain or bleeding when you pee.  You keep having watery poop (diarrhea).  You keep feeling sick to your stomach (nauseous) or keep throwing up (vomiting).  You have unusual fluid (discharge) coming from your vagina.  You have a rash.  You have a reaction to your medicine.  Your pain medicine does not help. Get help right away if:  You have a fever and your symptoms get worse all of a sudden.  You have very bad belly (abdominal) pain.  You are short of breath.  You pass out (faint).  You have pain, swelling, or redness of your  leg.  You bleed a lot from your vagina and notice clumps of blood (clots). Summary  Do not take baths, swim, or use a hot tub until your doctor says it is okay. Ask your doctor if you can take showers. You may only be allowed to take sponge baths for bathing.  Follow your doctor's advice about exercise, driving, and general activities. Ask your doctor what activities are safe for you.  Do not lift anything that is heavier than 10 lb (4.5 kg), or the limit that your doctor tells you, until he or she says that it is safe.  Try to have someone at home with you for the first 1-2 weeks to help. This information is not intended to replace advice given to you by your health care provider. Make sure you discuss any questions you have with your health care provider. Document Released: 11/08/2007 Document Revised: 01/18/2016 Document Reviewed: 01/18/2016 Elsevier Interactive Patient Education  2019 [employer].

## 2018-01-30 NOTE — Progress Notes (Signed)
1 Day Post-Op Procedure(s) (LRB): HYSTERECTOMY ABDOMINAL WITH SALPINGECTOMY (Bilateral) CYSTOSCOPY (N/A) LYSIS OF ADHESION (N/A)  Late entry:  Pt seen at 7:25 am  Subjective: Pt's pain not well controlled overnight.  Finally got relief with Oxycodone this am.  Pt had nausea overnight without vomiting.  Pt kept down crackers and water this am.  +Ambulation in halls.Pt denies h/o HTN. BP is normall 120s/80s.      Objective: I have reviewed patient's vital signs, intake and output and labs. Temp:  [98.9 F (37.2 C)-99.9 F (37.7 C)] 99.9 F (37.7 C) (12/19 1512) Pulse Rate:  [67-75] 70 (12/19 1512) Resp:  [17-20] 18 (12/19 1512) BP: (115-174)/(68-86) 115/70 (12/19 1512) SpO2:  [99 %-100 %] 100 % (12/19 1512) Weight:  [79.4 kg] 79.4 kg (12/18 2027)    Intake/Output Summary (Last 24 hours) at 01/30/2018 1756 Last data filed at 01/30/2018 0800 Gross per 24 hour  Intake 902.19 ml  Output 1825 ml  Net -922.81 ml   Gen:  NAD, appears comfortable. Abd:  Soft, nondistended.  Appropriately tender. Ext:  SCDs in place.   CBC Latest Ref Rng & Units 01/30/2018 01/24/2018  WBC 4.0 - 10.5 K/uL 16.2(H) 7.6  Hemoglobin 12.0 - 15.0 g/dL 9.6(L) 10.2(L)  Hematocrit 36.0 - 46.0 % 29.4(L) 32.0(L)  Platelets 150 - 400 K/uL 336 321    Assessment: s/p Procedure(s): HYSTERECTOMY ABDOMINAL WITH SALPINGECTOMY (Bilateral) CYSTOSCOPY (N/A) LYSIS OF ADHESION (N/A): stable Elevated BP without a h/o HTN  Plan: Advance diet Encourage ambulation  Routine post op care. S/P Toradol IV scheduled x 4 doses.  Start Toradol 10 mg po q 6 hours. S/p Experal intraop.  Pt should have started to see benefit 8 hours after administration. Treat BP if remains elevated.  Addendum: Pt given HCTZ 12. 5 mg this am.  Discharge home tomorrow if pain controlled and tolerating a regular diet.   LOS: 1 day    Thurnell Lose 01/30/2018, 5:50 PM

## 2018-01-31 MED ORDER — HYDROXYZINE HCL 10 MG PO TABS
10.0000 mg | ORAL_TABLET | Freq: Three times a day (TID) | ORAL | Status: DC | PRN
  Administered 2018-01-31: 10 mg via ORAL
  Filled 2018-01-31 (×3): qty 1

## 2018-01-31 MED ORDER — DOCUSATE SODIUM 100 MG PO CAPS: 100.0000 mg | ORAL_CAPSULE | Freq: Two times a day (BID) | ORAL | 0 refills | Status: DC

## 2018-01-31 MED ORDER — BISACODYL 5 MG PO TBEC
5.0000 mg | DELAYED_RELEASE_TABLET | Freq: Every day | ORAL | Status: DC | PRN
  Filled 2018-01-31: qty 1

## 2018-01-31 MED ORDER — FLEET ENEMA 7-19 GM/118ML RE ENEM
1.0000 | ENEMA | Freq: Once | RECTAL | Status: AC
  Administered 2018-01-31: 1 via RECTAL

## 2018-01-31 MED ORDER — IBUPROFEN 600 MG PO TABS: 600.0000 mg | ORAL_TABLET | Freq: Four times a day (QID) | ORAL | 0 refills | Status: DC | PRN

## 2018-01-31 MED ORDER — ACETAMINOPHEN 325 MG PO TABS: 650.0000 mg | ORAL_TABLET | Freq: Four times a day (QID) | ORAL | 1 refills | Status: AC | PRN

## 2018-01-31 NOTE — Progress Notes (Signed)
Reviewed discharge instructions with patient.  Discussed medication changes including Oxycodone per Dr Nelda Marseille.  Also discussed postoperative care and follow up appointments.  Patient verbalized understanding.  No questions at this time.  IVs removed.

## 2018-01-31 NOTE — Progress Notes (Signed)
2 Days Post-Op Procedure(s) (LRB): HYSTERECTOMY ABDOMINAL WITH SALPINGECTOMY (Bilateral) CYSTOSCOPY (N/A) LYSIS OF ADHESION (N/A)  Late entry:  Pt seen at 7:25 am  Subjective: Pt reports that she had a tough night- noted mostly gas pain especially upper abdomen.  She has not yet passed flatus, no BM.  Ambulating.  Tolerating clears and some crackers- reports vomiting x 2 after pain medication.  Denies F/C/CP/SOB.  Voiding freely  Objective: I have reviewed patient's vital signs, intake and output and labs. Temp:  [98.4 F (36.9 C)-99.9 F (37.7 C)] 98.4 F (36.9 C) (12/20 0522) Pulse Rate:  [70-73] 71 (12/20 0522) Resp:  [15-18] 15 (12/20 0522) BP: (115-153)/(70-87) 150/80 (12/20 0522) SpO2:  [99 %-100 %] 100 % (12/20 0522)    Intake/Output Summary (Last 24 hours) at 01/31/2018 0703 Last data filed at 01/30/2018 0800 Gross per 24 hour  Intake -  Output 300 ml  Net -300 ml   Gen:  NAD, appears comfortable CV: RRR Lungs: CTAB Abd:  Soft, nondistended, non-tender, +BS Incision: C/D/I Ext:  SCDs in place, no calf tenderness bilaterally  CBC Latest Ref Rng & Units 01/30/2018 01/24/2018  WBC 4.0 - 10.5 K/uL 16.2(H) 7.6  Hemoglobin 12.0 - 15.0 g/dL 9.6(L) 10.2(L)  Hematocrit 36.0 - 46.0 % 29.4(L) 32.0(L)  Platelets 150 - 400 K/uL 336 321    Assessment: s/p Procedure(s): HYSTERECTOMY ABDOMINAL WITH SALPINGECTOMY (Bilateral) CYSTOSCOPY (N/A) LYSIS OF ADHESION (N/A): stable Elevated BP without a h/o HTN  Plan: Plan for Dulcolax this am and encourage ambulation Advance diet as tolerated Continue routine post op care. S/P Toradol IV scheduled x 4 doses.  Continue Toradol 10 mg po q 6 hours. BP still elevated this am- suspect due to discomfort, plan to closely monitor  DISPO: For now continue with in-house monitoring until patient is adequately passing gas and pain has improved   LOS: 2 days    Annalee Genta 01/31/2018, 7:03 AM

## 2018-01-31 NOTE — Progress Notes (Deleted)
Rn discussed discharge instructions with patient.  Reviewed postoperative care, medication changes, signs of infection at the incision site and follow up appointments.  Patient verbalized understanding.  IV removed at this time.

## 2018-02-01 NOTE — Discharge Summary (Signed)
Physician Discharge Summary  Patient ID: Saleha Kalp MRN: [id number] DOB/AGE: 46-09-1970 46 y.o.  Admit date: 01/29/2018 Discharge date: 02/01/2018  Admission Diagnoses: Uterine fibroids  Discharge Diagnoses:  Active Problems:   Fibroids   S/P TAH-BSO   Discharged Condition: stable  Hospital Course: 47yo admitted for scheduled TAH, BS.  See operative note completed by Dr. Simona Huh.  Her postop course was uncomplicated, she was discharged home once she was passing flatus, pain controlled and ambulating/voiding without difficulty  Consults: None  Significant Diagnostic Studies: labs:  CBC Latest Ref Rng & Units 01/30/2018 01/24/2018  WBC 4.0 - 10.5 K/uL 16.2(H) 7.6  Hemoglobin 12.0 - 15.0 g/dL 9.6(L) 10.2(L)  Hematocrit 36.0 - 46.0 % 29.4(L) 32.0(L)  Platelets 150 - 400 K/uL 336 321     Treatments: IV hydration, antibiotics: Ancef, analgesia: Toradol and surgery: TAH, BS  Discharge Exam: Blood pressure (!) 155/97, pulse 81, temperature 99.5 F (37.5 C), temperature source Oral, resp. rate 16, height 5' 4.5" (1.638 m), weight 79.4 kg, last menstrual period 01/21/2018, SpO2 100 %. Gen:  NAD, appears comfortable CV: RRR         Lungs: CTAB Abd:  Soft, nondistended, non-tender, +BS Incision: C/D/I Ext:  SCDs in place, no calf tenderness bilaterally  Disposition:    Allergies as of 01/31/2018   No Known Allergies     Medication List    TAKE these medications   acetaminophen 325 MG tablet Commonly known as:  TYLENOL Take 2 tablets (650 mg total) by mouth every 6 (six) hours as needed for mild pain, moderate pain or fever.   docusate sodium 100 MG capsule Commonly known as:  COLACE Take 1 capsule (100 mg total) by mouth 2 (two) times daily.   ibuprofen 600 MG tablet Commonly known as:  ADVIL,MOTRIN Take 1 tablet (600 mg total) by mouth every 6 (six) hours as needed.   INTEGRA 62.5-62.5-40-3 MG Caps Take 1 capsule by mouth daily.      Follow-up Information     Thurnell Lose, MD Follow up in 2 week(s).   Specialty:  Obstetrics and Gynecology Contact information: [address] [place] [address] [phone]           Signed: Annalee Genta 02/01/2018, 2:48 PM

## 2018-02-02 ENCOUNTER — Other Ambulatory Visit: Payer: Self-pay

## 2018-02-02 ENCOUNTER — Emergency Department (HOSPITAL_BASED_OUTPATIENT_CLINIC_OR_DEPARTMENT_OTHER)
Admission: EM | Admit: 2018-02-02 | Discharge: 2018-02-02 | Disposition: A | Discharge status: Home / Self Care | Payer: BC Managed Care – PPO | Attending: Emergency Medicine | Admitting: Emergency Medicine

## 2018-02-02 ENCOUNTER — Encounter (HOSPITAL_BASED_OUTPATIENT_CLINIC_OR_DEPARTMENT_OTHER): Payer: Self-pay | Admitting: Emergency Medicine

## 2018-02-02 DIAGNOSIS — E876 Hypokalemia: Secondary | ICD-10-CM

## 2018-02-02 DIAGNOSIS — Z90710 Acquired absence of both cervix and uterus: Secondary | ICD-10-CM | POA: Diagnosis not present

## 2018-02-02 DIAGNOSIS — Z79899 Other long term (current) drug therapy: Secondary | ICD-10-CM | POA: Diagnosis not present

## 2018-02-02 DIAGNOSIS — R112 Nausea with vomiting, unspecified: Secondary | ICD-10-CM | POA: Diagnosis present

## 2018-02-02 LAB — CBC WITH DIFFERENTIAL/PLATELET
Abs Immature Granulocytes: 0.06 10*3/uL (ref 0.00–0.07)
BASOS ABS: 0 10*3/uL (ref 0.0–0.1)
Basophils Relative: 0 %
EOS PCT: 0 %
Eosinophils Absolute: 0.1 10*3/uL (ref 0.0–0.5)
HCT: 38.3 % (ref 36.0–46.0)
Hemoglobin: 11.9 g/dL — ABNORMAL LOW (ref 12.0–15.0)
Immature Granulocytes: 0 %
Lymphocytes Relative: 16 %
Lymphs Abs: 2.2 10*3/uL (ref 0.7–4.0)
MCH: 26.3 pg (ref 26.0–34.0)
MCHC: 31.1 g/dL (ref 30.0–36.0)
MCV: 84.7 fL (ref 80.0–100.0)
MONO ABS: 0.9 10*3/uL (ref 0.1–1.0)
Monocytes Relative: 6 %
NRBC: 0 % (ref 0.0–0.2)
Neutro Abs: 11 10*3/uL — ABNORMAL HIGH (ref 1.7–7.7)
Neutrophils Relative %: 78 %
Platelets: 445 10*3/uL — ABNORMAL HIGH (ref 150–400)
RBC: 4.52 MIL/uL (ref 3.87–5.11)
RDW: 17.5 % — ABNORMAL HIGH (ref 11.5–15.5)
WBC: 14.2 10*3/uL — AB (ref 4.0–10.5)

## 2018-02-02 LAB — COMPREHENSIVE METABOLIC PANEL
ALT: 12 U/L (ref 0–44)
AST: 20 U/L (ref 15–41)
Albumin: 4.4 g/dL (ref 3.5–5.0)
Alkaline Phosphatase: 74 U/L (ref 38–126)
Anion gap: 9 (ref 5–15)
BUN: 15 mg/dL (ref 6–20)
CO2: 29 mmol/L (ref 22–32)
CREATININE: 1.04 mg/dL — AB (ref 0.44–1.00)
Calcium: 9.4 mg/dL (ref 8.9–10.3)
Chloride: 98 mmol/L (ref 98–111)
GFR calc Af Amer: >60 mL/min (ref >60)
GFR calc non Af Amer: >60 mL/min (ref >60)
Glucose, Bld: 133 mg/dL — ABNORMAL HIGH (ref 70–99)
Potassium: 2.8 mmol/L — ABNORMAL LOW (ref 3.5–5.1)
SODIUM: 136 mmol/L (ref 135–145)
Total Bilirubin: 0.8 mg/dL (ref 0.3–1.2)
Total Protein: 9.3 g/dL — ABNORMAL HIGH (ref 6.5–8.1)

## 2018-02-02 LAB — LIPASE, BLOOD: Lipase: 45 U/L (ref 11–51)

## 2018-02-02 MED ORDER — SODIUM CHLORIDE 0.9 % IV BOLUS
1000.0000 mL | Freq: Once | INTRAVENOUS | Status: AC
  Administered 2018-02-02: 1000 mL via INTRAVENOUS

## 2018-02-02 MED ORDER — POTASSIUM CHLORIDE CRYS ER 20 MEQ PO TBCR: 20.0000 meq | EXTENDED_RELEASE_TABLET | Freq: Two times a day (BID) | ORAL | 0 refills | Status: DC

## 2018-02-02 MED ORDER — ONDANSETRON HCL 4 MG/2ML IJ SOLN
4.0000 mg | Freq: Once | INTRAMUSCULAR | Status: AC
  Administered 2018-02-02: 4 mg via INTRAVENOUS
  Filled 2018-02-02: qty 2

## 2018-02-02 MED ORDER — ONDANSETRON 8 MG PO TBDP: ORAL_TABLET | ORAL | 0 refills | Status: DC

## 2018-02-02 NOTE — Discharge Instructions (Addendum)
Zofran as prescribed as needed for nausea.  Begin taking potassium as prescribed this evening.  Follow-up with your GYN provider if symptoms are not improving in the next 2 to 3 days, sooner if you develop abdominal pain, redness or drainage from the incision site, or other new and concerning symptoms.

## 2018-02-02 NOTE — ED Provider Notes (Signed)
[facility] Provider Note   CSN: [id number] Arrival date & time: 02/02/18  0425     History   Chief Complaint Chief Complaint  Patient presents with  . Emesis    post op partial hysterectomy on Wed    HPI Kayra Crowell is a 47 y.o. female.  Patient is a 47 year old female with history of uterine fibroids.  She underwent a abdominal hysterectomy.  This was performed 4 days ago at [facility].  Since returning home, she has had issues with nausea and vomiting.  She has been passing flatus, but not much stool.  She denies any fevers or chills.  She denies any significant abdominal pain.  Denies any ill contacts.  The history is provided by the patient.  Emesis   This is a new problem. Episode onset: 3 days ago. The problem occurs continuously. The problem has not changed since onset.The emesis has an appearance of stomach contents. There has been no fever. Pertinent negatives include no abdominal pain, no chills, no diarrhea and no fever.    Past Medical History:  Diagnosis Date  . Anemia   . PONV (postoperative nausea and vomiting)     Patient Active Problem List   Diagnosis Date Noted  . Fibroids 01/29/2018  . S/P TAH-BSO 01/29/2018  . Cervical disc disorder with radiculopathy of cervical region 08/06/2016    Past Surgical History:  Procedure Laterality Date  . CESAREAN SECTION     x 3  . CYSTOSCOPY N/A 01/29/2018   Procedure: CYSTOSCOPY;  Surgeon: Thurnell Lose, MD;  Location: [facility];  Service: Gynecology;  Laterality: N/A;  . HYSTERECTOMY ABDOMINAL WITH SALPINGECTOMY Bilateral 01/29/2018   Procedure: HYSTERECTOMY ABDOMINAL WITH SALPINGECTOMY;  Surgeon: Thurnell Lose, MD;  Location: [facility];  Service: Gynecology;  Laterality: Bilateral;  . LYSIS OF ADHESION N/A 01/29/2018   Procedure: LYSIS OF ADHESION;  Surgeon: Thurnell Lose, MD;  Location: [facility];  Service: Gynecology;  Laterality: N/A;  . TOOTH EXTRACTION     general  anesthesia  . TUBAL LIGATION       OB History   No obstetric history on file.      Home Medications    Prior to Admission medications   Medication Sig Start Date End Date Taking? Authorizing Provider  oxycodone (OXY-IR) 5 MG capsule Take 5 mg by mouth every 4 (four) hours as needed.   Yes [provider]  acetaminophen (TYLENOL) 325 MG tablet Take 2 tablets (650 mg total) by mouth every 6 (six) hours as needed for mild pain, moderate pain or fever. 01/31/18 03/02/18  Janyth Pupa, DO  docusate sodium (COLACE) 100 MG capsule Take 1 capsule (100 mg total) by mouth 2 (two) times daily. 01/31/18   Janyth Pupa, DO  Fe Fum-FePoly-Vit C-Vit B3 (INTEGRA) 62.5-62.5-40-3 MG CAPS Take 1 capsule by mouth daily. 01/01/18   [provider]  ibuprofen (ADVIL,MOTRIN) 600 MG tablet Take 1 tablet (600 mg total) by mouth every 6 (six) hours as needed. 01/31/18   Janyth Pupa, DO    Family History History reviewed. No pertinent family history.  Social History Social History   Tobacco Use  . Smoking status: Never Smoker  . Smokeless tobacco: Never Used  Substance Use Topics  . Alcohol use: Yes    Alcohol/week: 2.0 - 3.0 standard drinks    Types: 2 - 3 Glasses of wine per week  . Drug use: No     Allergies   Patient has no known allergies.  Review of Systems Review of Systems  Constitutional: Negative for chills and fever.  Gastrointestinal: Positive for vomiting. Negative for abdominal pain and diarrhea.  All other systems reviewed and are negative.    Physical Exam Updated Vital Signs BP (!) 152/90 (BP Location: Right Arm)   Pulse 99   Temp 98.3 F (36.8 C) (Oral)   Resp 18   Ht 5' 4.5" (1.638 m)   Wt 79.4 kg   LMP 01/21/2018 (Exact Date)   SpO2 100%   BMI 29.58 kg/m   Physical Exam Vitals signs and nursing note reviewed.  Constitutional:      General: She is not in acute distress.    Appearance: She is well-developed. She is not diaphoretic.    HENT:     Head: Normocephalic and atraumatic.     Mouth/Throat:     Mouth: Mucous membranes are moist.  Neck:     Musculoskeletal: Normal range of motion and neck supple.  Cardiovascular:     Rate and Rhythm: Normal rate and regular rhythm.     Heart sounds: No murmur. No friction rub. No gallop.   Pulmonary:     Effort: Pulmonary effort is normal. No respiratory distress.     Breath sounds: Normal breath sounds. No wheezing.  Abdominal:     General: Bowel sounds are normal. There is no distension.     Palpations: Abdomen is soft.     Tenderness: There is no abdominal tenderness.     Comments: The incision across the lower abdomen appears to be clean, without drainage, and without erythema.  The original surgical honeycomb dressing is in place.  Musculoskeletal: Normal range of motion.  Skin:    General: Skin is warm and dry.  Neurological:     Mental Status: She is alert and oriented to person, place, and time.      ED Treatments / Results  Labs (all labs ordered are listed, but only abnormal results are displayed) Labs Reviewed  COMPREHENSIVE METABOLIC PANEL  LIPASE, BLOOD  CBC WITH DIFFERENTIAL/PLATELET    EKG None  Radiology No results found.  Procedures Procedures (including critical care time)  Medications Ordered in ED Medications  ondansetron (ZOFRAN) injection 4 mg (has no administration in time range)  sodium chloride 0.9 % bolus 1,000 mL (has no administration in time range)     Initial Impression / Assessment and Plan / ED Course  I have reviewed the triage vital signs and the nursing notes.  Pertinent labs & imaging results that were available during my care of the patient were reviewed by me and considered in my medical decision making (see chart for details).  Patient presents here with complaints of nausea and vomiting.  She is 4 days status post abdominal hysterectomy. I see no indication for any imaging studies.  She was given IV fluids and  laboratory studies were obtained.  Her hemoglobin is actually trending upward from discharge.  She does also have a white count of 14.2, the significance of which I am uncertain.  Her abdomen is benign and she appears to be healing well.  The only other significant laboratory abnormality is her potassium is 2.8.  She will be prescribed oral potassium replacement, Zofran, and I believe is appropriate for discharge.  Final Clinical Impressions(s) / ED Diagnoses   Final diagnoses:  None    ED Discharge Orders    None       Veryl Speak, MD 02/02/18 [phone]

## 2018-02-02 NOTE — ED Triage Notes (Addendum)
Pt reports partial hysterectomy on Wednesday at [facility]. Pt reports vomiting since surgery, reports three to four episodes of vomiting in the last 24 hours, reports no blood in vomiting. Some post surgical pain. Concerned that she has low iron-hx of same.

## 2018-02-02 NOTE — ED Notes (Signed)
ED Provider at bedside. 

## 2018-02-10 NOTE — Op Note (Signed)
NAMEJANIA, [name] MEDICAL RECORD [id number] ACCOUNT [id number] DATE OF BIRTH:Jun 15, 1970 FACILITY: [facility] LOCATION: [id number] PHYSICIAN:Nayib Remer Al Decant, MD  OPERATIVE REPORT  DATE OF PROCEDURE:  01/29/2018  PREOPERATIVE DIAGNOSES:  Fibroids and menorrhagia.  POSTOPERATIVE DIAGNOSES:  Fibroids and menorrhagia; dense adhesions.  PROCEDURE:  Abdominal hysterectomy with bilateral salpingectomy, cystoscopy and lysis of adhesions greater than 45 minutes.  SURGEON:  Thurnell Lose, MD  ASSISTANT:  Janyth Pupa, DO assisting due to technical difficulty; also technician assistant.  ANESTHESIA:  Local and general.  Local is :  Exparel and Marcaine 0.25% mixed 40 mL total.  ESTIMATED BLOOD LOSS:  350.  BLOOD ADMINISTERED:  None.  DRAINS:  Foley catheter.  SPECIMEN:  Uterus with cervix and bilateral fallopian tubes.  DISPOSITION OF SPECIMEN:  Pathology.  PATIENT DISPOSITION:  To PACU hemodynamically stable.  FINDINGS:  Densely adhered uterus to the anterior abdominal wall.  Left ovary adhered to the uterus.  Bladder densely adhered to the mid to lower uterine segment.  Normal ovaries and tubes bilaterally.  No bladder injury per cystoscopy, no suture in  uterus was noted.  Bilateral ureteral jets noted.  INDICATIONS:  The patient presented with a history of menorrhagia.  On exam, her uterus was near the umbilicus.  Ultrasound confirmed large multiple fibroids.  The patient has had 3 C-sections.  She was counseled on robotic hysterectomy, uterine artery  embolization, abdominal hysterectomy.  The patient desired abdominal hysterectomy.  She was consented with risks, benefits and alternatives and counseled on the risk of need for blood transfusion.  The patient accepted risks.  PROCEDURE IN DETAIL:  The patient was identified in the holding area.  She was then taken to the operating room with IV running.  She was placed in the dorsal supine position and underwent endotracheal  anesthesia without complication.  Foley catheter was  placed under sterile technique.  She was then prepped and draped in a normal sterile fashion.  A timeout was performed.  SCDs were on her legs and operating.  She received Ancef 2 grams IV prior to the procedure.  Previous Pfannenstiel skin incision was identified.  Abdomen was marked.  Pfannenstiel skin incision was made with a scalpel and carried down to the underlying layer of the fascia with the Bovie.  There was not a lot of subcutaneous tissue; however, once  we get down to the fascia and we were attempting to expose the fascia, clear fluid was noted.  It was then identified that we had intraabdominal access.  In hindsight, it was the peritoneum and fascia all stuck together.  At the Pfannenstiel site, the  uterus was adhered right behind the incision.  It was noted that the uterus was densely adhered to the anterior abdominal wall.  I was able to make a window and with a series of free hand ties and resection of the peritoneum, the uterus was freed.  Once  it was freed, it was noted that the bladder was up to the middle of the uterus and above the lower uterine segment.  Prior to proceeding, sterile milk was infused into the bladder so that we were able to identify the bladder.  The bladder, once it was  filled, was well demarcated and we were able to take the adhesions down in a manner of blunt and sharp dissection.  We would eventually be able to get the bladder all the way down to actually visualize the lower uterine segment.  All of this was  performed when the uterus was  exteriorized.  The uterus was not exteriorized until we were able to free up the dense adhesions on the anterior abdominal wall.  Once we were able to exteriorize the uterus and the bladder was down, I took the uteroovarian ligament transected that x2.  I then grasped the round ligaments with Kocher clamps and developed a space and then put a Heaney clamp on the adnexa and I  was  able to transect or remove the ovary from the uterus.  Also, I skeletonized the fallopian tubes.  I would then eventually be able to open the broad ligament and skeletonize to adequately visualize the uterine artery.  There were dense adhesions on the  left side.  I then switched sides with Dr. Nelda Marseille and we were using the LigaSure.  The left ovary was adhered to the uterus.  I was able to grasp the ovarian suspensory ligament and transect that.  Prior to that, I did transect the round ligament in a  similar fashion as on the right side.  After we released the ovary, I was able to skeletonize the fallopian tube and that was removed atraumatically, but suture ligated.  Once the ovaries were removed from the uterus, I again skeletonized the broad  ligament.  Able to see the uterine artery.  That was grasped with the LigaSure and transected in a usual fashion.  Once the uterine arteries were grasped, the Kocher clamps were placed at the apex of the cervix x2 and the uterus was amputated from the  cervix with a scalpel.  Once the uterus was out of the field, we were able to place retractors to visualize the bladder and cervix.  The patient did have a long cervix so we skeletonized the cervix with straight Heaney clamps and the LigaSure until we were at the vaginal fornix.   That was grasped with a curved Heaney and the vagina was entered with the Jorgenson scissors.  We came across because we were not in on the left hand side.  We did remove the cervix in its entirety with no vaginal tissue was noted.  The cervix was  inspected to see if it was removed in its entirety, and it was.  The angles were then sutured with 0 Vicryl in a Heaney stitch and tagged x2.  The uterine cuff was then closed with a series of interrupted sutures of 0 Vicryl as well.  Once the vaginal cuff was closed, copious irrigation was performed and the sutures  were then cut.  We would eventually put in some Arista for  hemostasis.  Patient did have some adhesions in the upper abdomen.  Prior to closure, we did look at the intestines and looked at the omentum.  There was no bleeding noted.  We looked at the intestines extensively.  At one point, the bladder was packed, which again  prior to doing that, we did evaluate.  When we put in the retractors, we put in the moist laparotomy sponges.  When we were ready to close, we removed all the moist laparotomy sponges and the count was performed.  There was no identifiable peritoneum noted.  I then closed the rectus muscles and the fascia was inspected.  In order to getting in due to the density there was an area that looked like maybe a ventral fat hernia that was entered.  That was closed with a  series of interrupted sutures.  The fascia was then closed with PDS loop x2.  The subcutaneous space was then  closed with 2-0 plain gut and the suture was buried in the subcutaneous space.  The skin was then reapproximated with 4-0 Monocryl in a  subcuticular fashion.  Copious irrigation was performed at all layers.  We constantly made sure that there was no bladder injury during the procedure, but due to the extensive dissection of adhesions, I proceeded with a cystoscopy to make sure that no injury was visible to the bladder, no sutures and that the ureters were  patent.  Attention was turned to the pelvis.  A Foley catheter was removed.  Betadine was used to prep the urethral meatus.  The cystoscope was then advanced, a 70 degree angle scope was used.  Methylene blue had been given prior to this, approximately 20  minutes.  The dome of the bladder appeared intact.  There were no sutures visible.  Then, the ureteral jets were seen bilaterally.  Cystoscope was then removed.  The patient was placed in dorsal lithotomy during this portion of the procedure.  All instrument, sponge and needle counts were correct x3.  The patient was taken to the recovery room in stable condition.   Foley catheter was to remain in place.  JN/NUANCE  D:02/09/2018 T:02/10/2018 JOB:[id number]

## 2018-06-06 ENCOUNTER — Encounter (HOSPITAL_BASED_OUTPATIENT_CLINIC_OR_DEPARTMENT_OTHER): Payer: BC Managed Care – PPO

## 2018-06-23 ENCOUNTER — Other Ambulatory Visit (HOSPITAL_BASED_OUTPATIENT_CLINIC_OR_DEPARTMENT_OTHER): Payer: Self-pay | Admitting: Obstetrics and Gynecology

## 2018-06-23 DIAGNOSIS — Z1231 Encounter for screening mammogram for malignant neoplasm of breast: Secondary | ICD-10-CM

## 2018-06-30 ENCOUNTER — Ambulatory Visit (HOSPITAL_BASED_OUTPATIENT_CLINIC_OR_DEPARTMENT_OTHER): Payer: BC Managed Care – PPO

## 2018-07-03 ENCOUNTER — Other Ambulatory Visit: Payer: Self-pay

## 2018-07-03 ENCOUNTER — Encounter (HOSPITAL_BASED_OUTPATIENT_CLINIC_OR_DEPARTMENT_OTHER): Payer: Self-pay

## 2018-07-03 ENCOUNTER — Ambulatory Visit (HOSPITAL_BASED_OUTPATIENT_CLINIC_OR_DEPARTMENT_OTHER)
Admission: RE | Admit: 2018-07-03 | Discharge: 2018-07-03 | Disposition: A | Discharge status: Home / Self Care | Payer: BC Managed Care – PPO | Source: Ambulatory Visit | Attending: Obstetrics and Gynecology | Admitting: Obstetrics and Gynecology

## 2018-07-03 DIAGNOSIS — Z1231 Encounter for screening mammogram for malignant neoplasm of breast: Secondary | ICD-10-CM

## 2018-07-08 ENCOUNTER — Other Ambulatory Visit: Payer: Self-pay | Admitting: Obstetrics and Gynecology

## 2018-07-08 DIAGNOSIS — R928 Other abnormal and inconclusive findings on diagnostic imaging of breast: Secondary | ICD-10-CM

## 2018-07-11 ENCOUNTER — Ambulatory Visit
Admission: RE | Admit: 2018-07-11 | Discharge: 2018-07-11 | Disposition: A | Discharge status: Home / Self Care | Payer: BC Managed Care – PPO | Source: Ambulatory Visit | Attending: Obstetrics and Gynecology | Admitting: Obstetrics and Gynecology

## 2018-07-11 ENCOUNTER — Other Ambulatory Visit: Payer: Self-pay

## 2018-07-11 ENCOUNTER — Other Ambulatory Visit: Payer: Self-pay | Admitting: Obstetrics and Gynecology

## 2018-07-11 DIAGNOSIS — R928 Other abnormal and inconclusive findings on diagnostic imaging of breast: Secondary | ICD-10-CM

## 2018-07-16 ENCOUNTER — Ambulatory Visit
Admission: RE | Admit: 2018-07-16 | Discharge: 2018-07-16 | Disposition: A | Discharge status: Home / Self Care | Payer: BC Managed Care – PPO | Source: Ambulatory Visit | Attending: Obstetrics and Gynecology | Admitting: Obstetrics and Gynecology

## 2018-07-16 ENCOUNTER — Other Ambulatory Visit: Payer: Self-pay

## 2018-07-16 DIAGNOSIS — R928 Other abnormal and inconclusive findings on diagnostic imaging of breast: Secondary | ICD-10-CM

## 2020-06-08 ENCOUNTER — Telehealth: Payer: Self-pay | Admitting: Obstetrics and Gynecology

## 2020-07-02 NOTE — Patient Instructions (Addendum)
It was great to meet you today! I will be in touch with your labs asap We will also sent you a Cologuard kit to screen for colon cancer at home Stop by imaging on the ground floor to get or schedule your mammo-  I also recommend a covid booster for you! Work on regular exercise and try to lose a few lbs- this will help your knees!   Health Maintenance, Female Adopting a healthy lifestyle and getting preventive care are important in promoting health and wellness. Ask your health care provider about:  The right schedule for you to have regular tests and exams.  Things you can do on your own to prevent diseases and keep yourself healthy. What should I know about diet, weight, and exercise? Eat a healthy diet  Eat a diet that includes plenty of vegetables, fruits, low-fat dairy products, and lean protein.  Do not eat a lot of foods that are high in solid fats, added sugars, or sodium.   Maintain a healthy weight Body mass index (BMI) is used to identify weight problems. It estimates body fat based on height and weight. Your health care provider can help determine your BMI and help you achieve or maintain a healthy weight. Get regular exercise Get regular exercise. This is one of the most important things you can do for your health. Most adults should:  Exercise for at least 150 minutes each week. The exercise should increase your heart rate and make you sweat (moderate-intensity exercise).  Do strengthening exercises at least twice a week. This is in addition to the moderate-intensity exercise.  Spend less time sitting. Even light physical activity can be beneficial. Watch cholesterol and blood lipids Have your blood tested for lipids and cholesterol at 49 years of age, then have this test every 5 years. Have your cholesterol levels checked more often if:  Your lipid or cholesterol levels are high.  You are older than 49 years of age.  You are at high risk for heart disease. What  should I know about cancer screening? Depending on your health history and family history, you may need to have cancer screening at various ages. This may include screening for:  Breast cancer.  Cervical cancer.  Colorectal cancer.  Skin cancer.  Lung cancer. What should I know about heart disease, diabetes, and high blood pressure? Blood pressure and heart disease  High blood pressure causes heart disease and increases the risk of stroke. This is more likely to develop in people who have high blood pressure readings, are of African descent, or are overweight.  Have your blood pressure checked: ? Every 3-5 years if you are 74-63 years of age. ? Every year if you are 48 years old or older. Diabetes Have regular diabetes screenings. This checks your fasting blood sugar level. Have the screening done:  Once every three years after age 15 if you are at a normal weight and have a low risk for diabetes.  More often and at a younger age if you are overweight or have a high risk for diabetes. What should I know about preventing infection? Hepatitis B If you have a higher risk for hepatitis B, you should be screened for this virus. Talk with your health care provider to find out if you are at risk for hepatitis B infection. Hepatitis C Testing is recommended for:  Everyone born from [birth year] through 1964.  Anyone with known risk factors for hepatitis C. Sexually transmitted infections (STIs)  Get screened for  STIs, including gonorrhea and chlamydia, if: ? You are sexually active and are younger than 49 years of age. ? You are older than 49 years of age and your health care provider tells you that you are at risk for this type of infection. ? Your sexual activity has changed since you were last screened, and you are at increased risk for chlamydia or gonorrhea. Ask your health care provider if you are at risk.  Ask your health care provider about whether you are at high risk for HIV. Your  health care provider may recommend a prescription medicine to help prevent HIV infection. If you choose to take medicine to prevent HIV, you should first get tested for HIV. You should then be tested every 3 months for as long as you are taking the medicine. Pregnancy  If you are about to stop having your period (premenopausal) and you may become pregnant, seek counseling before you get pregnant.  Take 400 to 800 micrograms (mcg) of folic acid every day if you become pregnant.  Ask for birth control (contraception) if you want to prevent pregnancy. Osteoporosis and menopause Osteoporosis is a disease in which the bones lose minerals and strength with aging. This can result in bone fractures. If you are 37 years old or older, or if you are at risk for osteoporosis and fractures, ask your health care provider if you should:  Be screened for bone loss.  Take a calcium or vitamin D supplement to lower your risk of fractures.  Be given hormone replacement therapy (HRT) to treat symptoms of menopause. Follow these instructions at home: Lifestyle  Do not use any products that contain nicotine or tobacco, such as cigarettes, e-cigarettes, and chewing tobacco. If you need help quitting, ask your health care provider.  Do not use street drugs.  Do not share needles.  Ask your health care provider for help if you need support or information about quitting drugs. Alcohol use  Do not drink alcohol if: ? Your health care provider tells you not to drink. ? You are pregnant, may be pregnant, or are planning to become pregnant.  If you drink alcohol: ? Limit how much you use to 0-1 drink a day. ? Limit intake if you are breastfeeding.  Be aware of how much alcohol is in your drink. In the [place] one drink equals one 12 oz bottle of beer (355 mL), one 5 oz glass of wine (148 mL), or one 1 oz glass of hard liquor (44 mL). General instructions  Schedule regular health, dental, and eye  exams.  Stay current with your vaccines.  Tell your health care provider if: ? You often feel depressed. ? You have ever been abused or do not feel safe at home. Summary  Adopting a healthy lifestyle and getting preventive care are important in promoting health and wellness.  Follow your health care provider's instructions about healthy diet, exercising, and getting tested or screened for diseases.  Follow your health care provider's instructions on monitoring your cholesterol and blood pressure. This information is not intended to replace advice given to you by your health care provider. Make sure you discuss any questions you have with your health care provider. Document Revised: 01/22/2018 Document Reviewed: 01/22/2018 Elsevier Patient Education  2021 [employer].

## 2020-07-02 NOTE — Progress Notes (Addendum)
[facility] [place], [place], [place] [address] [phone]  Date:  07/04/2020   Name:  [name]   DOB:  [birth year]   MRN:  [id number]  PCP:  Darreld Mclean, MD    Chief Complaint: New Patient (Initial Visit) (cpe)   History of Present Illness:  [name] is a 50 y.o. very pleasant female patient who presents with the following:  New patient here today to establish care and have CPE - I took care of her mother until she passed away last year   She is generally in good health  Never smoker She is status post hysterectomy- she still has her ovaries,hyst done due to fibroids  mammo 2020- she did have a bx in the past but it was benign Due for screening mammo  She recently took a new job with [employer]  She has 3 daughters ages 10, 2 and 37 yo- all live nearby  One grandson who is 7 months old Married for 27 years   Will get routine labs today  No family history of colon cancer-she would like to do cologuard after discussion of options   She is trying to get more exercise  In her free time she likes to rest and relax   Encouraged 3rd dose of covid 19 vaccine   She does note some left knee pain and sometimes popping Referral to ortho desired   Patient Active Problem List   Diagnosis Date Noted  . Fibroids 01/29/2018  . S/P TAH-BSO 01/29/2018  . 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 Anemia   . PONV (postoperative nausea and vomiting)     Past Surgical History:  Procedure Laterality Date  . ABDOMINAL HYSTERECTOMY    . CESAREAN SECTION     x 3  . CYSTOSCOPY N/A 01/29/2018   Procedure: CYSTOSCOPY;  Surgeon: Thurnell Lose, MD;  Location: [facility];  Service: Gynecology;  Laterality: N/A;  . HYSTERECTOMY ABDOMINAL WITH SALPINGECTOMY Bilateral 01/29/2018   Procedure: HYSTERECTOMY  ABDOMINAL WITH SALPINGECTOMY;  Surgeon: Thurnell Lose, MD;  Location: [facility];  Service: Gynecology;  Laterality: Bilateral;  . LYSIS OF ADHESION N/A 01/29/2018   Procedure: LYSIS OF ADHESION;  Surgeon: Thurnell Lose, MD;  Location: [facility];  Service: Gynecology;  Laterality: N/A;  . TOOTH EXTRACTION     general anesthesia  . TUBAL LIGATION      Social History   Tobacco Use  . Smoking status: Never Smoker  . Smokeless tobacco: Never Used  Vaping Use  . Vaping Use: Never used  Substance Use Topics  . Alcohol use: Yes    Alcohol/week: 2.0 - 3.0 standard drinks    Types: 2 - 3 Glasses of wine per week  . Drug use: No    No family history on file.  No Known Allergies  Medication list has been reviewed and updated.  No current outpatient medications on file prior to visit.   No current facility-administered medications on file prior to visit.    Review of Systems:  As per HPI- otherwise negative.   Physical Examination: Vitals:   07/04/20 0840  BP: 124/82  Pulse: 73  Resp: 16  Temp: (!) 97.5 F (36.4 C)  SpO2: 98%   Vitals:   07/04/20 0840  Weight: 188 lb (85.3 kg)  Height: 5' 4.5" (1.638 m)  Body mass index is 31.77 kg/m. Ideal Body Weight: Weight in (lb) to have BMI = 25: 147.6  GEN: no acute distress.  Overweight, looks well  HEENT: Atraumatic, Normocephalic. Bilateral TM wnl, oropharynx normal.  PEERL,EOMI.  Ears and Nose: No external deformity. CV: RRR, No M/G/R. No JVD. No thrill. No extra heart sounds. PULM: CTA B, no wheezes, crackles, rhonchi. No retractions. No resp. distress. No accessory muscle use. ABD: S, NT, ND. No rebound. No HSM. EXTR: No c/c/e PSYCH: Normally interactive. Conversant.  Crepitus of left knee    Assessment and Plan: Physical exam  Encounter for screening mammogram for malignant neoplasm of breast - Plan: MM 3D SCREEN BREAST BILATERAL  Screening for deficiency anemia - Plan: CBC  Chronic pain of left knee - Plan:  Ambulatory referral to Orthopedic Surgery  Screening for diabetes mellitus - Plan: Comprehensive metabolic panel, Hemoglobin A1c  Fatigue, unspecified type - Plan: TSH, VITAMIN D 25 Hydroxy (Vit-D Deficiency, Fractures)  Screening for hyperlipidemia - Plan: Lipid panel  Screening for colon cancer  CPE today- encouraged healthy diet and exercise routine Recommend covid booster Will plan further follow- up pending labs. Ortho referral  Order mammo and cologuard    This visit occurred during the SARS-CoV-2 public health emergency.  Safety protocols were in place, including screening questions prior to the visit, additional usage of staff PPE, and extensive cleaning of exam room while observing appropriate contact time as indicated for disinfecting solutions.    Signed Lamar Blinks, MD  Received her labs as below, message to pt  Results for orders placed or performed in visit on 07/04/20  CBC  Result Value Ref Range   WBC 6.3 4.0 - 10.5 K/uL   RBC 4.12 3.87 - 5.11 Mil/uL   Platelets 208.0 150.0 - 400.0 K/uL   Hemoglobin 11.9 (L) 12.0 - 15.0 g/dL   HCT 35.6 (L) 36.0 - 46.0 %   MCV 86.5 78.0 - 100.0 fl   MCHC 33.5 30.0 - 36.0 g/dL   RDW 13.0 11.5 - 15.5 %  Comprehensive metabolic panel  Result Value Ref Range   Sodium 140 135 - 145 mEq/L   Potassium 3.6 3.5 - 5.1 mEq/L   Chloride 103 96 - 112 mEq/L   CO2 30 19 - 32 mEq/L   Glucose, Bld 106 (H) 70 - 99 mg/dL   BUN 9 6 - 23 mg/dL   Creatinine, Ser 0.84 0.40 - 1.20 mg/dL   Total Bilirubin 0.6 0.2 - 1.2 mg/dL   Alkaline Phosphatase 84 39 - 117 U/L   AST 18 0 - 37 U/L   ALT 12 0 - 35 U/L   Total Protein 7.4 6.0 - 8.3 g/dL   Albumin 4.2 3.5 - 5.2 g/dL   GFR 81.66 >60.00 mL/min   Calcium 9.0 8.4 - 10.5 mg/dL  Hemoglobin A1c  Result Value Ref Range   Hgb A1c MFr Bld 6.3 4.6 - 6.5 %  Lipid panel  Result Value Ref Range   Cholesterol 151 0 - 200 mg/dL   Triglycerides 78.0 0.0 - 149.0 mg/dL   HDL 38.70 (L) >39.00 mg/dL    VLDL 15.6 0.0 - 40.0 mg/dL   LDL Cholesterol 97 0 - 99 mg/dL   Total CHOL/HDL Ratio 4    NonHDL 112.18   TSH  Result Value Ref Range   TSH 0.37 0.35 - 4.50 uIU/mL  VITAMIN D 25 Hydroxy (Vit-D Deficiency, Fractures)  Result Value Ref Range   VITD 7.22 (L) 30.00 - 100.00  ng/mL   The 10-year ASCVD risk score Mikey Bussing DC Brooke Bonito., et al., 2013) is: 2%   Values used to calculate the score:     Age: 69 years     Sex: Female     Is Non-Hispanic African American: Yes     Diabetic: No     Tobacco smoker: No     Systolic Blood Pressure: 206 mmHg     Is BP treated: No     HDL Cholesterol: 38.7 mg/dL     Total Cholesterol: 151 mg/dL

## 2020-07-04 ENCOUNTER — Encounter: Payer: Self-pay | Admitting: Family Medicine

## 2020-07-04 ENCOUNTER — Other Ambulatory Visit: Payer: Self-pay

## 2020-07-04 ENCOUNTER — Ambulatory Visit: Payer: BC Managed Care – PPO | Admitting: Family Medicine

## 2020-07-04 VITALS — BP 124/82 | HR 73 | Temp 97.5°F | Resp 16 | Ht 64.5 in | Wt 188.0 lb

## 2020-07-04 DIAGNOSIS — Z13220 Encounter for screening for lipoid disorders: Secondary | ICD-10-CM | POA: Diagnosis not present

## 2020-07-04 DIAGNOSIS — G8929 Other chronic pain: Secondary | ICD-10-CM

## 2020-07-04 DIAGNOSIS — R5383 Other fatigue: Secondary | ICD-10-CM

## 2020-07-04 DIAGNOSIS — Z0000 Encounter for general adult medical examination without abnormal findings: Secondary | ICD-10-CM

## 2020-07-04 DIAGNOSIS — R7303 Prediabetes: Secondary | ICD-10-CM

## 2020-07-04 DIAGNOSIS — M25562 Pain in left knee: Secondary | ICD-10-CM | POA: Diagnosis not present

## 2020-07-04 DIAGNOSIS — Z130 Encounter for screening for diseases of the blood and blood-forming organs and certain disorders involving the immune mechanism: Secondary | ICD-10-CM | POA: Diagnosis not present

## 2020-07-04 DIAGNOSIS — Z1231 Encounter for screening mammogram for malignant neoplasm of breast: Secondary | ICD-10-CM | POA: Diagnosis not present

## 2020-07-04 DIAGNOSIS — E559 Vitamin D deficiency, unspecified: Secondary | ICD-10-CM

## 2020-07-04 DIAGNOSIS — Z131 Encounter for screening for diabetes mellitus: Secondary | ICD-10-CM

## 2020-07-04 DIAGNOSIS — Z1211 Encounter for screening for malignant neoplasm of colon: Secondary | ICD-10-CM

## 2020-07-04 LAB — VITAMIN D 25 HYDROXY (VIT D DEFICIENCY, FRACTURES): VITD: 7.22 ng/mL — ABNORMAL LOW (ref 30.00–100.00)

## 2020-07-04 LAB — COMPREHENSIVE METABOLIC PANEL
ALT: 12 U/L (ref 0–35)
AST: 18 U/L (ref 0–37)
Albumin: 4.2 g/dL (ref 3.5–5.2)
Alkaline Phosphatase: 84 U/L (ref 39–117)
BUN: 9 mg/dL (ref 6–23)
CO2: 30 mEq/L (ref 19–32)
Calcium: 9 mg/dL (ref 8.4–10.5)
Chloride: 103 mEq/L (ref 96–112)
Creatinine, Ser: 0.84 mg/dL (ref 0.40–1.20)
GFR: 81.66 mL/min (ref >60.00)
Glucose, Bld: 106 mg/dL — ABNORMAL HIGH (ref 70–99)
Potassium: 3.6 mEq/L (ref 3.5–5.1)
Sodium: 140 mEq/L (ref 135–145)
Total Bilirubin: 0.6 mg/dL (ref 0.2–1.2)
Total Protein: 7.4 g/dL (ref 6.0–8.3)

## 2020-07-04 LAB — LIPID PANEL
Cholesterol: 151 mg/dL (ref 0–200)
HDL: 38.7 mg/dL — ABNORMAL LOW (ref >39.00)
LDL Cholesterol: 97 mg/dL (ref 0–99)
NonHDL: 112.18
Total CHOL/HDL Ratio: 4
Triglycerides: 78 mg/dL (ref 0.0–149.0)
VLDL: 15.6 mg/dL (ref 0.0–40.0)

## 2020-07-04 LAB — CBC
HCT: 35.6 % — ABNORMAL LOW (ref 36.0–46.0)
Hemoglobin: 11.9 g/dL — ABNORMAL LOW (ref 12.0–15.0)
MCHC: 33.5 g/dL (ref 30.0–36.0)
MCV: 86.5 fl (ref 78.0–100.0)
Platelets: 208 10*3/uL (ref 150.0–400.0)
RBC: 4.12 Mil/uL (ref 3.87–5.11)
RDW: 13 % (ref 11.5–15.5)
WBC: 6.3 10*3/uL (ref 4.0–10.5)

## 2020-07-04 LAB — HEMOGLOBIN A1C: Hgb A1c MFr Bld: 6.3 % (ref 4.6–6.5)

## 2020-07-04 LAB — TSH: TSH: 0.37 u[IU]/mL (ref 0.35–4.50)

## 2020-07-04 MED ORDER — VITAMIN D3 1.25 MG (50000 UT) PO CAPS: ORAL_CAPSULE | ORAL | 0 refills | Status: DC

## 2020-07-04 NOTE — Addendum Note (Signed)
Addended by: Darreld Mclean on: 07/04/2020 07:29 PM   Modules accepted: Orders

## 2020-07-04 NOTE — Addendum Note (Signed)
Addended by: Wynonia Musty A on: 07/04/2020 09:55 AM   Modules accepted: Orders

## 2020-07-05 ENCOUNTER — Encounter (HOSPITAL_BASED_OUTPATIENT_CLINIC_OR_DEPARTMENT_OTHER): Payer: Self-pay

## 2020-07-05 ENCOUNTER — Ambulatory Visit (HOSPITAL_BASED_OUTPATIENT_CLINIC_OR_DEPARTMENT_OTHER)
Admission: RE | Admit: 2020-07-05 | Discharge: 2020-07-05 | Disposition: A | Discharge status: Home / Self Care | Payer: BC Managed Care – PPO | Source: Ambulatory Visit | Attending: Family Medicine | Admitting: Family Medicine

## 2020-07-05 DIAGNOSIS — Z1231 Encounter for screening mammogram for malignant neoplasm of breast: Secondary | ICD-10-CM | POA: Diagnosis present

## 2020-07-12 LAB — COLOGUARD: Cologuard: NEGATIVE

## 2020-07-20 ENCOUNTER — Encounter: Payer: Self-pay | Admitting: Family Medicine

## 2020-07-20 ENCOUNTER — Telehealth: Payer: BC Managed Care – PPO | Admitting: Nurse Practitioner

## 2020-07-20 DIAGNOSIS — M545 Low back pain, unspecified: Secondary | ICD-10-CM

## 2020-07-20 MED ORDER — NAPROXEN 500 MG PO TABS: 500.0000 mg | ORAL_TABLET | Freq: Two times a day (BID) | ORAL | 1 refills | Status: DC

## 2020-07-20 MED ORDER — CYCLOBENZAPRINE HCL 10 MG PO TABS: 10.0000 mg | ORAL_TABLET | Freq: Three times a day (TID) | ORAL | 1 refills | Status: DC | PRN

## 2020-07-20 NOTE — Progress Notes (Signed)

## 2021-03-19 NOTE — Progress Notes (Signed)
[place] at [facility] [address] [place], [place] [address] [phone]  Date:  03/20/2021   Name:  [name]   DOB:  05-Mar-1970   MRN:  [id number]  PCP:  Darreld Mclean, MD    Chief Complaint: Possible ear infection (L ear. Pt says she had sinus drainage at the beginning of January and she thinks this is related. )   History of Present Illness:  [name] is a 51 y.o. very pleasant female patient who presents with the following:  Generally healthy woman, history of prediabetes and vitamin D deficiency Last visit with myself May of last year for physical and to establish care Married with 3 adult daughters, 1 toddler grandson.  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/2018   Cervical disc disorder with radiculopathy of cervical region 08/06/2016    Past Medical History:  Diagnosis Date   Anemia    PONV (postoperative nausea and vomiting)     Past Surgical  History:  Procedure Laterality Date   ABDOMINAL HYSTERECTOMY     CESAREAN SECTION     x 3   CYSTOSCOPY N/A 01/29/2018   Procedure: CYSTOSCOPY;  Surgeon: Thurnell Lose, MD;  Location: [facility];  Service: Gynecology;  Laterality: N/A;   HYSTERECTOMY ABDOMINAL WITH SALPINGECTOMY Bilateral 01/29/2018   Procedure: HYSTERECTOMY ABDOMINAL WITH SALPINGECTOMY;  Surgeon: Thurnell Lose, MD;  Location: [facility];  Service: Gynecology;  Laterality: Bilateral;   LYSIS OF ADHESION N/A 01/29/2018   Procedure: LYSIS OF ADHESION;  Surgeon: Thurnell Lose, MD;  Location: [facility];  Service: Gynecology;  Laterality: N/A;   TOOTH EXTRACTION     general anesthesia   TUBAL LIGATION      Social History   Tobacco Use   Smoking status: Never   Smokeless tobacco: Never  Vaping Use   Vaping Use: Never used  Substance Use Topics   Alcohol use: Yes    Alcohol/week: 2.0 - 3.0 standard drinks    Types: 2 - 3 Glasses of wine per week   Drug use: No    No family history on file.  No Known Allergies  Medication list has been reviewed and updated.  No current outpatient medications on file prior to visit.   No current facility-administered medications on file prior to visit.    Review of Systems:  As per HPI- otherwise negative.   Physical Examination: Vitals:   03/20/21 1540 03/20/21 1551  BP: (!) 148/84 140/90  Pulse: 74  Resp: 18   Temp: 98.4 F (36.9 C)   SpO2: 98%    Vitals:   03/20/21 1540  Weight: 194 lb 3.2 oz (88.1 kg)  Height: 5' 5"  (1.651 m)   Body mass index is 32.32 kg/m. Ideal Body Weight: Weight in (lb) to have BMI = 25: 149.9  GEN: no acute distress.  Mild obesity, looks well HEENT: Atraumatic, Normocephalic.  Right TM and ear canal normal.  Left ear canal normal, there is potentially minimal injection of the TM.  Oropharynx normal.  PEERL,EOMI. nasal cavity is erythematous and inflamed Ears and Nose: No external deformity. CV: RRR, No M/G/R. No JVD. No thrill. No extra  heart sounds. PULM: CTA B, no wheezes, crackles, rhonchi. No retractions. No resp. distress. No accessory muscle use. ABD: S, NT, ND EXTR: No c/c/e PSYCH: Normally interactive. Conversant.    Assessment and Plan: Left ear pain - Plan: amoxicillin (AMOXIL) 500 MG capsule  Acute non-recurrent frontal sinusitis - Plan: amoxicillin (AMOXIL) 500 MG capsule  Elevated blood pressure reading  Patient seen today with left ear discomfort.  Exam is relatively benign Her symptoms seem to have begun with a sinus infection which has not been treated as of yet.  Discussed antibiotics, she would like to give this a try.  Called in amoxicillin for her, she will let me know if this does not resolve her symptoms  We also discussed borderline elevated blood pressure.  I encouraged her to get a home blood pressure cuff so she can monitor her readings at home  Signed Lamar Blinks, MD

## 2021-03-20 ENCOUNTER — Ambulatory Visit: Payer: BC Managed Care – PPO | Admitting: Family Medicine

## 2021-03-20 ENCOUNTER — Encounter: Payer: Self-pay | Admitting: Family Medicine

## 2021-03-20 VITALS — BP 140/90 | HR 74 | Temp 98.4°F | Resp 18 | Ht 65.0 in | Wt 194.2 lb

## 2021-03-20 DIAGNOSIS — R030 Elevated blood-pressure reading, without diagnosis of hypertension: Secondary | ICD-10-CM | POA: Diagnosis not present

## 2021-03-20 DIAGNOSIS — H9202 Otalgia, left ear: Secondary | ICD-10-CM

## 2021-03-20 DIAGNOSIS — J0110 Acute frontal sinusitis, unspecified: Secondary | ICD-10-CM

## 2021-03-20 MED ORDER — AMOXICILLIN 500 MG PO CAPS: 1000.0000 mg | ORAL_CAPSULE | Freq: Two times a day (BID) | ORAL | 0 refills | Status: DC

## 2021-03-20 NOTE — Patient Instructions (Signed)
Good to see you today- we will try amoxicillin for possible sinus infection Please alert me if your symptoms do not resolve   Please get a BP cuff for home and monitor your BP a few times a month.  If you are consistently higher than 140/90 please let me know!

## 2021-06-20 NOTE — Progress Notes (Addendum)
Therapist, [facility] at [facility] ?[address], [address] ?[place], [place] [address] ?336 [phone] ?Fax [phone] ? ?Date:  06/21/2021  ? ?Name:  [name]   DOB:  October 15, 1970   MRN:  [id number] ? ?PCP:  Adalie Mand, Gay Filler, MD  ? ? ?Chief Complaint: Pain (Pt c/o: pain in left knee (possible arthritis) and pain in right foot heel (possible plantar fasciitis)) ? ? ?History of Present Illness: ? ?[name] is a 51 y.o. very pleasant female patient who presents with the following: ? ?Patient seen today with concern of knee and foot pain-generally healthy, history of prediabetes and vitamin D deficiency ?Most recent visit with myself was in February for sick visit ?Married with 3 adult daughters, 1 toddler grandson ? ?At her last visit her blood pressure was borderline elevated, monitor today ? ?Tdap- declines today  ?Can offer routine blood work today-HIV and hep C screening ? ?Left knee: she has noted pain for about 6 months- maybe a year ?NKI, it has been getting worse  ?She sits at a computer all day- it hurts even more when she gets up to walk  ?It does pop and click, can be unstable and get stuck ?Mild swelling perhaps ?No imaging done ? ?Right foot: she has noted pain in the heel for about 2-3 months ?NKI ?She notes it most when she gets up in the am ?She tries to wear supportive shoes  ? ?She is trying to do more walking for exercise over the last week or so- maybe 2-3 walking sessions for an hour; the next day her pain will be worse  ?Patient Active Problem List  ? Diagnosis Date Noted  ? Vitamin D deficiency 07/04/2020  ? Prediabetes 07/04/2020  ? Fibroids 01/29/2018  ? S/P TAH-BSO 01/29/2018  ? Cervical disc disorder with radiculopathy of cervical region 08/06/2016  ? ? ?Past Medical History:  ?Diagnosis Date  ? Anemia   ? PONV (postoperative nausea and vomiting)   ? ? ?Past Surgical History:  ?Procedure Laterality Date  ? ABDOMINAL HYSTERECTOMY    ? CESAREAN SECTION    ?  x 3  ? CYSTOSCOPY N/A 01/29/2018  ? Procedure: CYSTOSCOPY;  Surgeon: Thurnell Lose, MD;  Location: [facility];  Service: Gynecology;  Laterality: N/A;  ? HYSTERECTOMY ABDOMINAL WITH SALPINGECTOMY Bilateral 01/29/2018  ? Procedure: HYSTERECTOMY ABDOMINAL WITH SALPINGECTOMY;  Surgeon: Thurnell Lose, MD;  Location: [facility];  Service: Gynecology;  Laterality: Bilateral;  ? LYSIS OF ADHESION N/A 01/29/2018  ? Procedure: LYSIS OF ADHESION;  Surgeon: Thurnell Lose, MD;  Location: [facility];  Service: Gynecology;  Laterality: N/A;  ? TOOTH EXTRACTION    ? general anesthesia  ? TUBAL LIGATION    ? ? ?Social History  ? ?Tobacco Use  ? Smoking status: Never  ? Smokeless tobacco: Never  ?Vaping Use  ? Vaping Use: Never used  ?Substance Use Topics  ? Alcohol use: Yes  ?  Alcohol/week: 2.0 - 3.0 standard drinks  ?  Types: 2 - 3 Glasses of wine per week  ? Drug use: No  ? ? ?No family history on file. ? ?No Known Allergies ? ?Medication list has been reviewed and updated. ? ?No current outpatient medications on file prior to visit.  ? ?No current facility-administered medications on file prior to visit.  ? ? ?Review of Systems: ? ?As per HPI- otherwise negative. ? ? ?Physical Examination: ?Vitals:  ? 06/21/21 1536  ?BP: 124/60  ?Pulse: 69  ?  Resp: 18  ?Temp: 98.1 ?F (36.7 ?C)  ?SpO2: 98%  ? ?Vitals:  ? 06/21/21 1536  ?Weight: 194 lb 3.2 oz (88.1 kg)  ?Height: 5' 5"  (1.651 m)  ? ?Body mass index is 32.32 kg/m?. ?Ideal Body Weight: Weight in (lb) to have BMI = 25: 149.9 ? ?GEN: no acute distress.  Mild obesity, looks well  ?HEENT: Atraumatic, Normocephalic.  ?Ears and Nose: No external deformity. ?CV: RRR, No M/G/R. No JVD. No thrill. No extra heart sounds. ?PULM: CTA B, no wheezes, crackles, rhonchi. No retractions. No resp. distress. No accessory muscle use. ?EXTR: No c/c/e ?PSYCH: Normally interactive. Conversant.  ?Left knee: joint is stable, no large effusion or swelling.  Tenderness at joint line lateral more than medial, no  crepitus ?Right foot:  tenderness over proximal plantar fascia, otherwise foot exam normal, achilles normal, MT non tender  ? ?Assessment and Plan: ?Chronic pain of left knee - Plan: Ambulatory referral to Orthopedic Surgery ? ?Screening for deficiency anemia - Plan: CBC ? ?Screening for thyroid disorder - Plan: TSH ? ?Encounter for hepatitis C screening test for low risk patient - Plan: Hepatitis C antibody ? ?Screening for diabetes mellitus - Plan: Comprehensive metabolic panel, Hemoglobin A1c ? ?Screening for hyperlipidemia - Plan: Lipid panel ? ?Plantar fasciitis of right foot ? ?Referral to ortho for her knee ?Discuss conservative care for foot ? ?Good to see you today- we will set you up to see orthopedics ?I will be in touch with your labs ?Suggest covid booster if not done yet  ? ?Try a knee compression sleeve, topical voltaren gel for now ?For your foot- supportive shoes, heel cups, ice massage, stretching before you get out of bed in the am  ? ? ?Signed ?Lamar Blinks, MD ? ?Received labs as below 5/12- message to pt ? ?Results for orders placed or performed in visit on 06/21/21  ?CBC  ?Result Value Ref Range  ? WBC 10.2 4.0 - 10.5 K/uL  ? RBC 4.25 3.87 - 5.11 Mil/uL  ? Platelets 229.0 150.0 - 400.0 K/uL  ? Hemoglobin 12.2 12.0 - 15.0 g/dL  ? HCT 36.7 36.0 - 46.0 %  ? MCV 86.5 78.0 - 100.0 fl  ? MCHC 33.2 30.0 - 36.0 g/dL  ? RDW 12.9 11.5 - 15.5 %  ?Comprehensive metabolic panel  ?Result Value Ref Range  ? Sodium 139 135 - 145 mEq/L  ? Potassium 3.7 3.5 - 5.1 mEq/L  ? Chloride 103 96 - 112 mEq/L  ? CO2 28 19 - 32 mEq/L  ? Glucose, Bld 95 70 - 99 mg/dL  ? BUN 9 6 - 23 mg/dL  ? Creatinine, Ser 0.78 0.40 - 1.20 mg/dL  ? Total Bilirubin 0.4 0.2 - 1.2 mg/dL  ? Alkaline Phosphatase 92 39 - 117 U/L  ? AST 16 0 - 37 U/L  ? ALT 17 0 - 35 U/L  ? Total Protein 7.6 6.0 - 8.3 g/dL  ? Albumin 4.4 3.5 - 5.2 g/dL  ? GFR 88.65 >60.00 mL/min  ? Calcium 9.2 8.4 - 10.5 mg/dL  ?Hemoglobin A1c  ?Result Value Ref Range  ? Hgb  A1c MFr Bld 6.0 4.6 - 6.5 %  ?Lipid panel  ?Result Value Ref Range  ? Cholesterol 173 0 - 200 mg/dL  ? Triglycerides 88.0 0.0 - 149.0 mg/dL  ? HDL 48.30 >39.00 mg/dL  ? VLDL 17.6 0.0 - 40.0 mg/dL  ? LDL Cholesterol 107 (H) 0 - 99 mg/dL  ? Total CHOL/HDL Ratio 4   ?  NonHDL 124.74   ?TSH  ?Result Value Ref Range  ? TSH 0.42 0.35 - 5.50 uIU/mL  ?Hepatitis C antibody  ?Result Value Ref Range  ? Hepatitis C Ab NON-REACTIVE NON-REACTIVE  ? SIGNAL TO CUT-OFF 0.16 <1.00  ? ? ? ?

## 2021-06-21 ENCOUNTER — Ambulatory Visit (INDEPENDENT_AMBULATORY_CARE_PROVIDER_SITE_OTHER): Payer: BC Managed Care – PPO | Admitting: Family Medicine

## 2021-06-21 VITALS — BP 124/60 | HR 69 | Temp 98.1°F | Resp 18 | Ht 65.0 in | Wt 194.2 lb

## 2021-06-21 DIAGNOSIS — M25562 Pain in left knee: Secondary | ICD-10-CM | POA: Diagnosis not present

## 2021-06-21 DIAGNOSIS — Z130 Encounter for screening for diseases of the blood and blood-forming organs and certain disorders involving the immune mechanism: Secondary | ICD-10-CM

## 2021-06-21 DIAGNOSIS — G8929 Other chronic pain: Secondary | ICD-10-CM

## 2021-06-21 DIAGNOSIS — M722 Plantar fascial fibromatosis: Secondary | ICD-10-CM

## 2021-06-21 DIAGNOSIS — Z1329 Encounter for screening for other suspected endocrine disorder: Secondary | ICD-10-CM | POA: Diagnosis not present

## 2021-06-21 DIAGNOSIS — Z131 Encounter for screening for diabetes mellitus: Secondary | ICD-10-CM | POA: Diagnosis not present

## 2021-06-21 DIAGNOSIS — Z13220 Encounter for screening for lipoid disorders: Secondary | ICD-10-CM | POA: Diagnosis not present

## 2021-06-21 DIAGNOSIS — Z1159 Encounter for screening for other viral diseases: Secondary | ICD-10-CM | POA: Diagnosis not present

## 2021-06-21 NOTE — Patient Instructions (Signed)
Good to see you today- we will set you up to see orthopedics ?I will be in touch with your labs ?Suggest covid booster if not done yet  ? ?Try a knee compression sleeve, topical voltaren gel for now ?For your foot- supportive shoes, heel cups, ice massage, stretching before you get out of bed in the am  ?

## 2021-06-22 LAB — CBC
HCT: 36.7 % (ref 36.0–46.0)
Hemoglobin: 12.2 g/dL (ref 12.0–15.0)
MCHC: 33.2 g/dL (ref 30.0–36.0)
MCV: 86.5 fl (ref 78.0–100.0)
Platelets: 229 10*3/uL (ref 150.0–400.0)
RBC: 4.25 Mil/uL (ref 3.87–5.11)
RDW: 12.9 % (ref 11.5–15.5)
WBC: 10.2 10*3/uL (ref 4.0–10.5)

## 2021-06-22 LAB — TSH: TSH: 0.42 u[IU]/mL (ref 0.35–5.50)

## 2021-06-22 LAB — COMPREHENSIVE METABOLIC PANEL
ALT: 17 U/L (ref 0–35)
AST: 16 U/L (ref 0–37)
Albumin: 4.4 g/dL (ref 3.5–5.2)
Alkaline Phosphatase: 92 U/L (ref 39–117)
BUN: 9 mg/dL (ref 6–23)
CO2: 28 mEq/L (ref 19–32)
Calcium: 9.2 mg/dL (ref 8.4–10.5)
Chloride: 103 mEq/L (ref 96–112)
Creatinine, Ser: 0.78 mg/dL (ref 0.40–1.20)
GFR: 88.65 mL/min (ref >60.00)
Glucose, Bld: 95 mg/dL (ref 70–99)
Potassium: 3.7 mEq/L (ref 3.5–5.1)
Sodium: 139 mEq/L (ref 135–145)
Total Bilirubin: 0.4 mg/dL (ref 0.2–1.2)
Total Protein: 7.6 g/dL (ref 6.0–8.3)

## 2021-06-22 LAB — HEPATITIS C ANTIBODY
Hepatitis C Ab: NONREACTIVE
SIGNAL TO CUT-OFF: 0.16 (ref <1.00)

## 2021-06-22 LAB — LIPID PANEL
Cholesterol: 173 mg/dL (ref 0–200)
HDL: 48.3 mg/dL (ref >39.00)
LDL Cholesterol: 107 mg/dL — ABNORMAL HIGH (ref 0–99)
NonHDL: 124.74
Total CHOL/HDL Ratio: 4
Triglycerides: 88 mg/dL (ref 0.0–149.0)
VLDL: 17.6 mg/dL (ref 0.0–40.0)

## 2021-06-22 LAB — HEMOGLOBIN A1C: Hgb A1c MFr Bld: 6 % (ref 4.6–6.5)

## 2021-06-23 ENCOUNTER — Encounter: Payer: Self-pay | Admitting: Family Medicine

## 2021-07-05 ENCOUNTER — Ambulatory Visit: Payer: BC Managed Care – PPO | Admitting: Orthopaedic Surgery

## 2021-07-17 ENCOUNTER — Ambulatory Visit: Payer: BC Managed Care – PPO | Admitting: Orthopaedic Surgery

## 2021-07-24 IMAGING — MG MM DIGITAL SCREENING BILAT W/ TOMO AND CAD
8 series · 8 of 24 positions shown · non-contrast
Comparison: Previous exam(s).

CLINICAL DATA: Screening.

EXAM:
DIGITAL SCREENING BILATERAL MAMMOGRAM WITH TOMOSYNTHESIS AND CAD
TECHNIQUE: Bilateral screening digital craniocaudal and mediolateral oblique
mammograms were obtained. Bilateral screening digital breast
tomosynthesis was performed. The images were evaluated with
computer-aided detection.

[R MLO synth-2D]
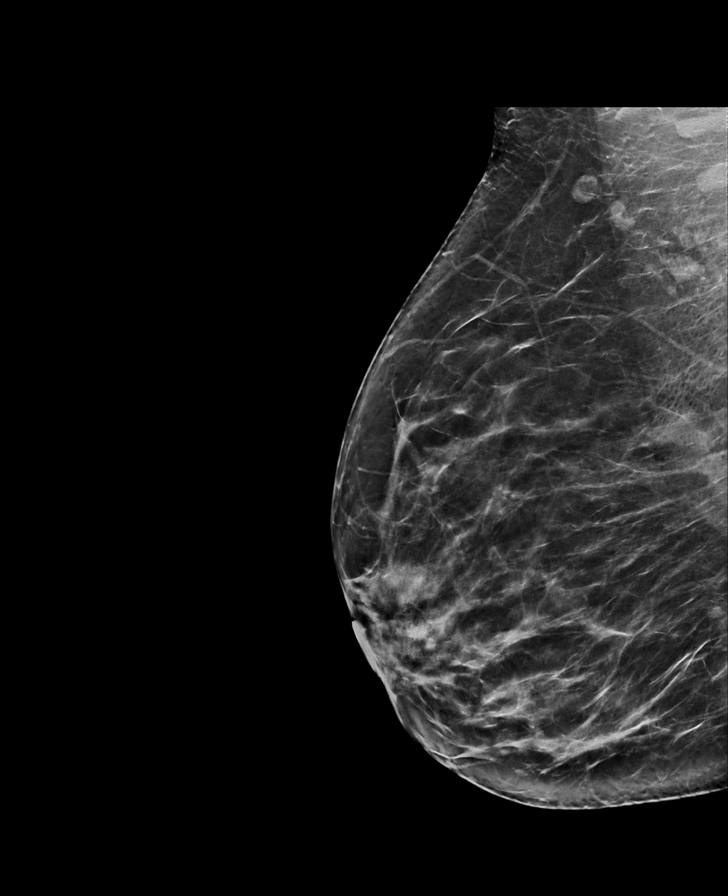

[R CC synth-2D]
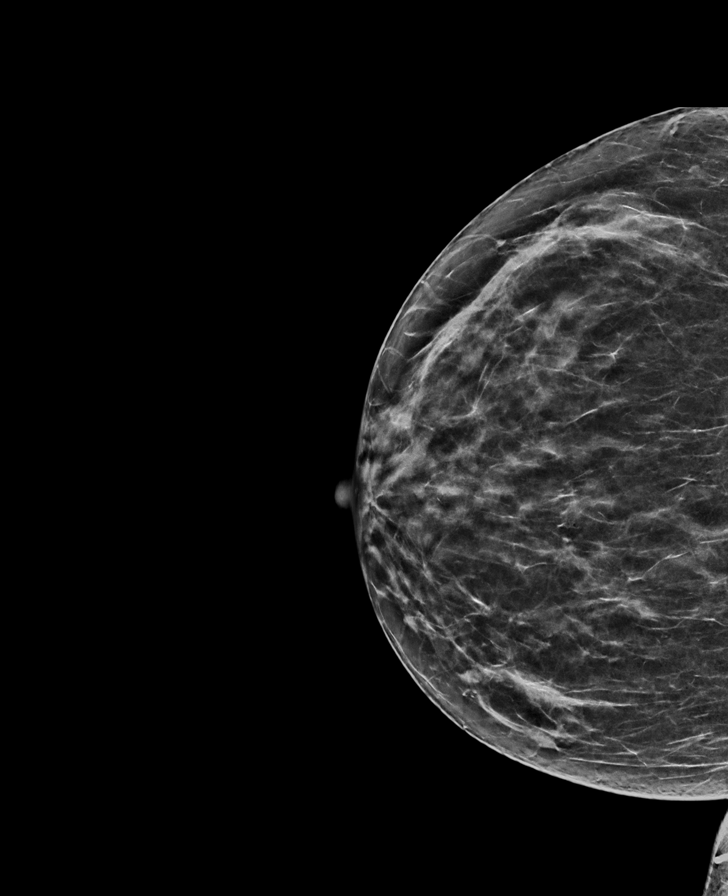

[L MLO synth-2D]
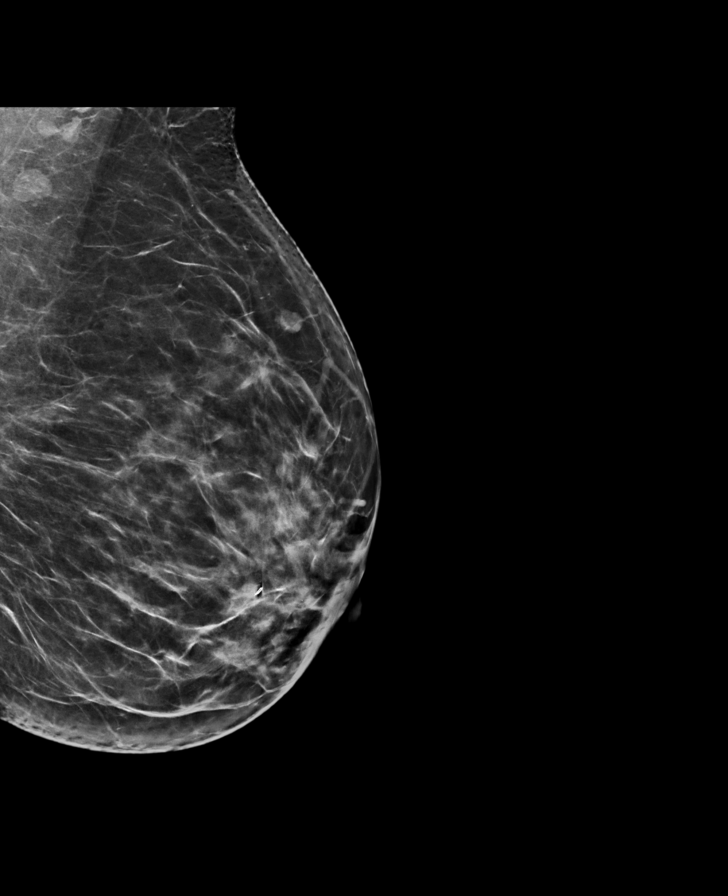

[L CC synth-2D]
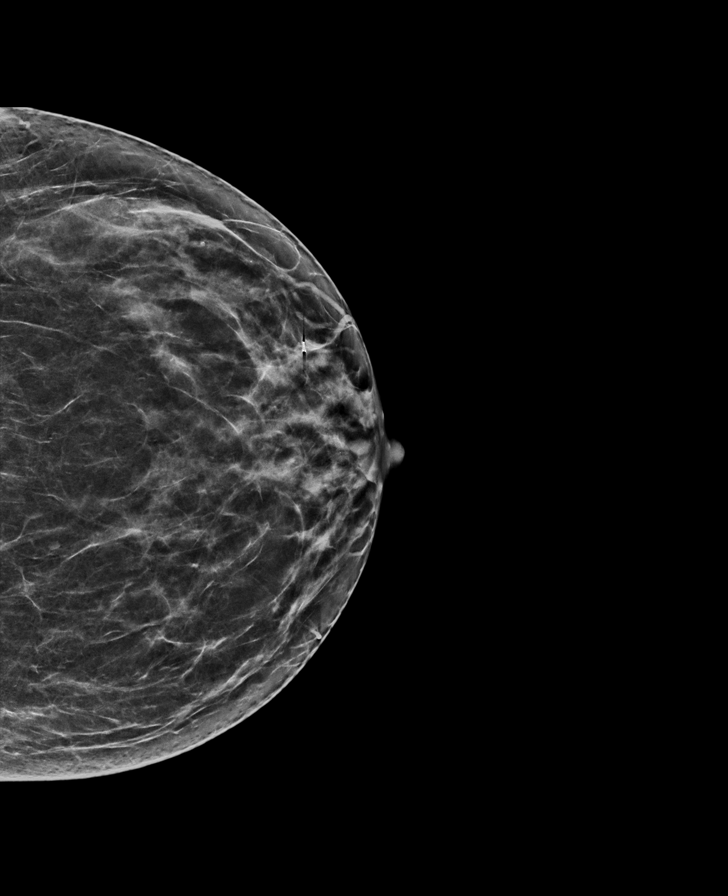

[R MLO tomo · tomo slice 39/76.0]
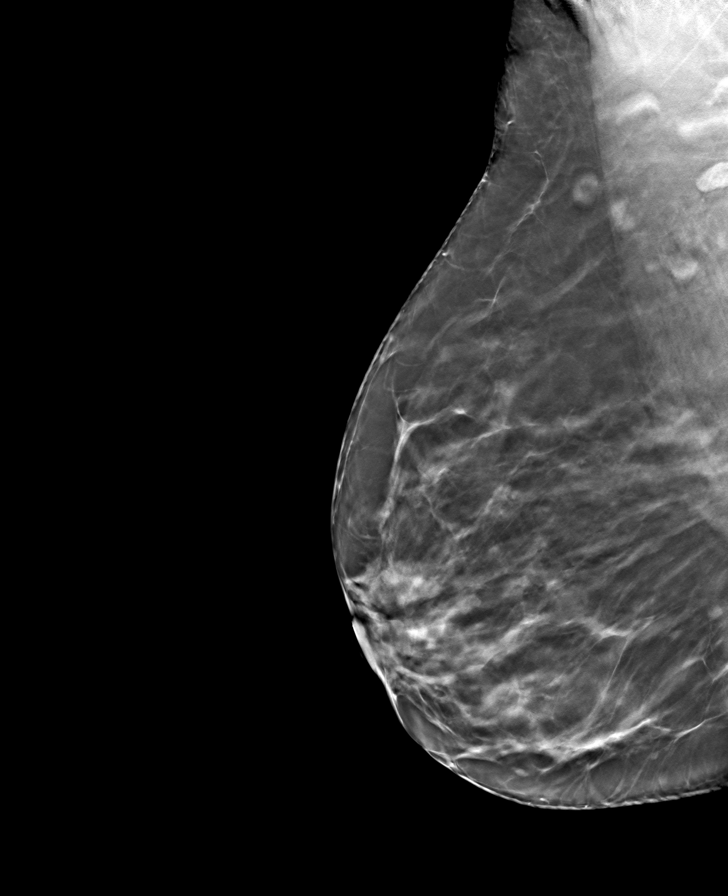

[R CC tomo · tomo slice 31/62.0]
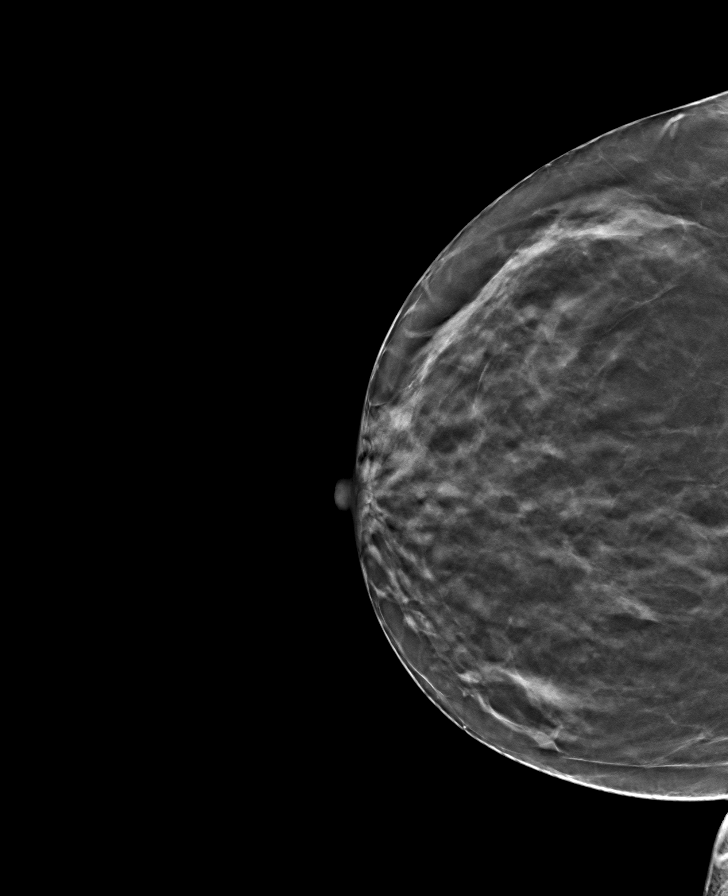

[L MLO tomo · tomo slice 37/72.0]
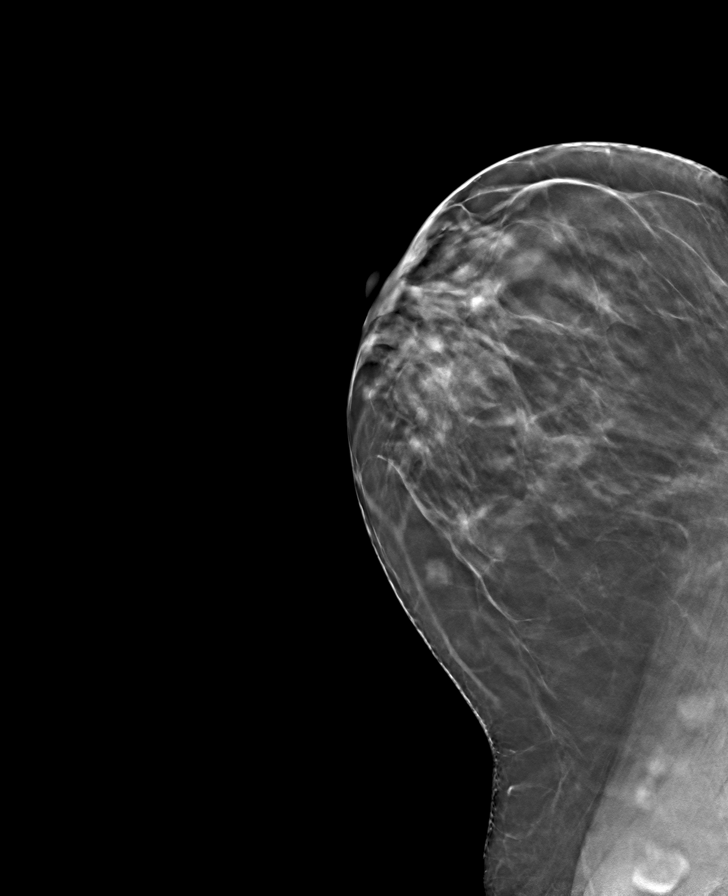

[L CC tomo · tomo slice 32/63.0]
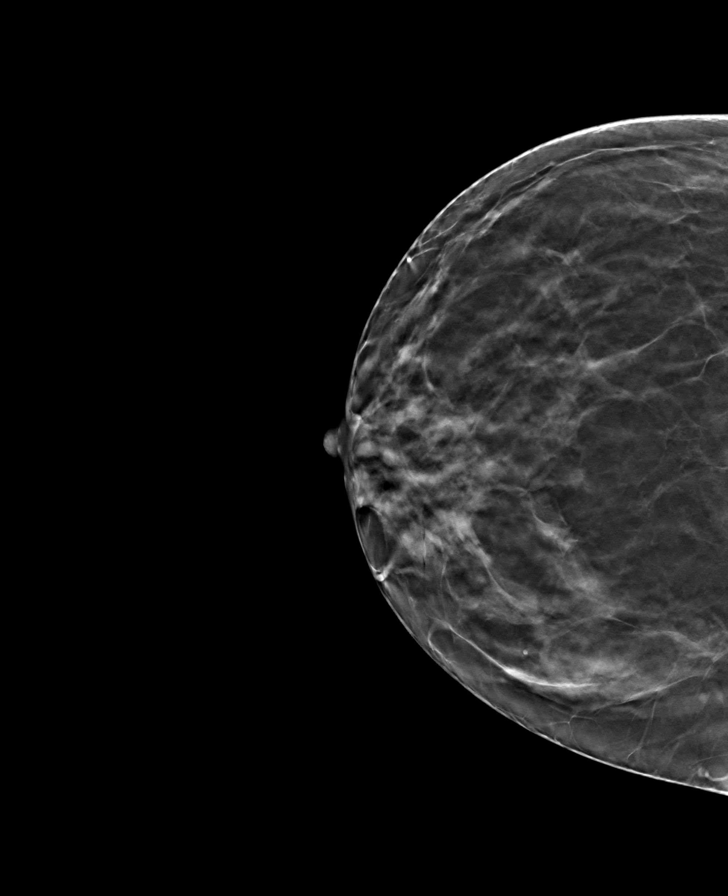

[8 of 24 positions shown; findings below may reference images not displayed]

ACR Breast Density Category c: The breast tissue is heterogeneously
dense, which may obscure small masses.
FINDINGS: There are no findings suspicious for malignancy. The images were
evaluated with computer-aided detection.
IMPRESSION: No mammographic evidence of malignancy. A result letter of this
screening mammogram will be mailed directly to the patient.

RECOMMENDATION:
Screening mammogram in one year. (Code:T4-5-GWO)

BI-RADS CATEGORY  1: Negative.

## 2021-07-27 ENCOUNTER — Telehealth: Payer: Self-pay | Admitting: Orthopaedic Surgery

## 2021-07-27 NOTE — Telephone Encounter (Signed)
Return call to  left pt and left vm. Pt sent a mychart asking for appt for 6/30 in morning. Left message no open appts on that date and will discuss other dates open for Dr. Ninfa [place]

## 2021-08-06 NOTE — Progress Notes (Signed)
[name] at [facility] [address] [place], [place] [address] [phone]  Date:  08/09/2021   Name:  [name]   DOB:  03/03/1970   MRN:  [id number]  PCP:  Darreld Mclean, MD    Chief Complaint: No chief complaint on file.   History of Present Illness:  [name] is a 51 y.o. very pleasant female patient who presents with the following:  Patient with history of prediabetes, vitamin D deficiency.  Virtual visit today for concern of hip pain Most recent visit with myself was last month for knee and foot pain-I made an orthopedics referral at that time- she is being seen on 7/15  Patient location is her job, my location is office Patient identity confirmed with 2 factors, she gives consent for virtual visit today The pt and myself are present on the call today  Pt notes pain in her left lower back/ upper buttock/ SI joint area- it has been present for about 2 months It hurts when she gets out of bed in the am or after sitting for a while she has more pain The right side is ok She cannot recall any injury - she has not been doing anything new  The pain may radiate down the left leg sometimes- to the level of her knee She may have a little weakness of the leg, but she thinks this is due to her knee issue which is already known No bowel or bladder dysfunction  She is using some ES tylenol as needed- mostly at night This does help her some   Patient Active Problem List   Diagnosis Date Noted   Vitamin D deficiency 07/04/2020   Prediabetes 07/04/2020   Fibroids 01/29/2018   S/P TAH-BSO 01/29/2018   Cervical disc disorder with radiculopathy of cervical region 08/06/2016    Past Medical History:  Diagnosis Date   Anemia    PONV (postoperative nausea and vomiting)     Past Surgical History:  Procedure Laterality Date   ABDOMINAL HYSTERECTOMY     CESAREAN SECTION     x 3   CYSTOSCOPY N/A  01/29/2018   Procedure: CYSTOSCOPY;  Surgeon: Thurnell Lose, MD;  Location: [facility];  Service: Gynecology;  Laterality: N/A;   HYSTERECTOMY ABDOMINAL WITH SALPINGECTOMY Bilateral 01/29/2018   Procedure: HYSTERECTOMY ABDOMINAL WITH SALPINGECTOMY;  Surgeon: Thurnell Lose, MD;  Location: [facility];  Service: Gynecology;  Laterality: Bilateral;   LYSIS OF ADHESION N/A 01/29/2018   Procedure: LYSIS OF ADHESION;  Surgeon: Thurnell Lose, MD;  Location: [facility];  Service: Gynecology;  Laterality: N/A;   TOOTH EXTRACTION     general anesthesia   TUBAL LIGATION      Social History   Tobacco Use   Smoking status: Never   Smokeless tobacco: Never  Vaping Use   Vaping Use: Never used  Substance Use Topics   Alcohol use: Yes    Alcohol/week: 2.0 - 3.0 standard drinks of alcohol    Types: 2 - 3 Glasses of wine per week   Drug use: No    No family history on file.  No Known Allergies  Medication list has been reviewed and updated.  No current outpatient medications on file prior to visit.   No current facility-administered medications on file prior to visit.    Review of Systems:  As per HPI- otherwise negative.   Physical Examination: There were no vitals filed for this visit. There  were no vitals filed for this visit. There is no height or weight on file to calculate BMI. Ideal Body Weight:    Pt observed via video monitor- she looks well, her normal self She indicates the left posterior/ superior buttock area as the area of concern  Assessment and Plan: Pain in joint of left hip - Plan: DG Hip Unilat W OR W/O Pelvis 2-3 Views Left, DG Si Joints, methocarbamol (ROBAXIN) 500 MG tablet  Chronic left-sided low back pain with left-sided sciatica - Plan: DG Lumbar Spine Complete  Virtual visit today to discuss left lower back/ hip/ SI joint pain She would like to do x-rays which I have ordered Ok to continue tylenol, robaxin rx provided cautioned regarding sedation Will plan  further follow- up pending x-ray reports  Video used for duration of visit today  Signed Lamar Blinks, MD

## 2021-08-09 ENCOUNTER — Encounter: Payer: Self-pay | Admitting: Family Medicine

## 2021-08-09 ENCOUNTER — Telehealth (INDEPENDENT_AMBULATORY_CARE_PROVIDER_SITE_OTHER): Payer: BC Managed Care – PPO | Admitting: Family Medicine

## 2021-08-09 DIAGNOSIS — G8929 Other chronic pain: Secondary | ICD-10-CM

## 2021-08-09 DIAGNOSIS — M5442 Lumbago with sciatica, left side: Secondary | ICD-10-CM | POA: Diagnosis not present

## 2021-08-09 DIAGNOSIS — M25552 Pain in left hip: Secondary | ICD-10-CM

## 2021-08-09 MED ORDER — METHOCARBAMOL 500 MG PO TABS: 500.0000 mg | ORAL_TABLET | Freq: Three times a day (TID) | ORAL | 3 refills | Status: AC | PRN

## 2021-08-10 ENCOUNTER — Encounter: Payer: Self-pay | Admitting: Family Medicine

## 2021-08-10 ENCOUNTER — Ambulatory Visit
Admission: RE | Admit: 2021-08-10 | Discharge: 2021-08-10 | Disposition: A | Discharge status: Home / Self Care | Payer: BC Managed Care – PPO | Source: Ambulatory Visit | Attending: Family Medicine | Admitting: Family Medicine

## 2021-08-10 DIAGNOSIS — G8929 Other chronic pain: Secondary | ICD-10-CM

## 2021-08-10 DIAGNOSIS — M25552 Pain in left hip: Secondary | ICD-10-CM

## 2021-08-24 ENCOUNTER — Ambulatory Visit: Payer: BC Managed Care – PPO | Admitting: Orthopaedic Surgery

## 2021-08-24 ENCOUNTER — Ambulatory Visit (INDEPENDENT_AMBULATORY_CARE_PROVIDER_SITE_OTHER): Payer: BC Managed Care – PPO

## 2021-08-24 VITALS — Wt 192.0 lb

## 2021-08-24 DIAGNOSIS — M25562 Pain in left knee: Secondary | ICD-10-CM | POA: Diagnosis not present

## 2021-08-24 DIAGNOSIS — G8929 Other chronic pain: Secondary | ICD-10-CM | POA: Diagnosis not present

## 2021-08-24 MED ORDER — CELECOXIB 200 MG PO CAPS
200.0000 mg | ORAL_CAPSULE | Freq: Two times a day (BID) | ORAL | 1 refills | Status: AC | PRN
Start: 2021-08-24 — End: ?

## 2021-08-24 NOTE — Progress Notes (Signed)
Office Visit Note   Patient: [name]           Date of Birth: August 05, 1970           MRN: [id number] Visit Date: 08/24/2021              Requested by: Darreld Mclean, MD [address] [place],  [place] [id number] PCP: Darreld Mclean, MD   Assessment & Plan: Visit Diagnoses:  1. Chronic pain of left knee     Plan: I went over the patient's clinical exam findings and x-ray findings with her.  We both agree that we will have her try quad strengthening exercises combined with an anti-inflammatory.  I will start her on Celebrex 200 mg up to twice daily with food as needed.  This may help her back as well.  I would like to see her back in 3 weeks to see how she is doing overall.  At that visit we may consider steroid injection in her left knee if she still having issues.  Follow-Up Instructions: Return in about 3 weeks (around 09/14/2021).   Orders:  Orders Placed This Encounter  Procedures   XR Knee 1-2 Views Left   Meds ordered this encounter  Medications   celecoxib (CELEBREX) 200 MG capsule    Sig: Take 1 capsule (200 mg total) by mouth 2 (two) times daily between meals as needed.    Dispense:  60 capsule    Refill:  1      Procedures: No procedures performed   Clinical Data: No additional findings.   Subjective: Chief Complaint  Patient presents with   Left Knee - Pain  The patient is a very pleasant 51 year old female appropriate referred from her primary care physician Dr. Edilia Bo to evaluate and treat chronic left knee pain.  She said her knee has been hurting for several months now.  Some of it is activity related.  She gets some popping in her knee and occasionally locking catching.  I do see that she has had x-rays of her hips in the last year and those to me appear normal.  There is little bit of arthritic change in the hips but she denies any groin pain at all.  She is never had injections in her left knee and never had surgery on  the knee.  She denies any specific injury to her left knee.  It has been come aggravating again over the last several months and it does bother her on a daily basis.  She is otherwise an active 51 year old female.  HPI  Review of Systems   Objective: Vital Signs: Wt 192 lb (87.1 kg)   LMP 01/21/2018 (Exact Date)   BMI 31.95 kg/m   Physical Exam She is alert and orient x3 and in no acute distress Ortho Exam Examination of her left knee shows no effusion.  She does have some slight medial joint line tenderness and is really irritated on the exam when I internally rotated to be against the femur bring her from flexion to extension hurts quite a bit on that medial compartment.  There is some slight patellofemoral crepitation.  Her knee feels ligamentously stable.  Her left hip exam is entirely normal. Specialty Comments:  No specialty comments available.  Imaging: XR Knee 1-2 Views Left  Result Date: 08/24/2021 2 views of the left knee show well maintained joint space with no malalignment and no acute findings.  There is no effusion.  PMFS History: Patient Active Problem List   Diagnosis Date Noted   Vitamin D deficiency 07/04/2020   Prediabetes 07/04/2020   Fibroids 01/29/2018   S/P TAH-BSO 01/29/2018   Cervical disc disorder with radiculopathy of cervical region 08/06/2016   Past Medical History:  Diagnosis Date   Anemia    PONV (postoperative nausea and vomiting)     No family history on file.  Past Surgical History:  Procedure Laterality Date   ABDOMINAL HYSTERECTOMY     CESAREAN SECTION     x 3   CYSTOSCOPY N/A 01/29/2018   Procedure: CYSTOSCOPY;  Surgeon: Thurnell Lose, MD;  Location: [facility];  Service: Gynecology;  Laterality: N/A;   HYSTERECTOMY ABDOMINAL WITH SALPINGECTOMY Bilateral 01/29/2018   Procedure: HYSTERECTOMY ABDOMINAL WITH SALPINGECTOMY;  Surgeon: Thurnell Lose, MD;  Location: [facility];  Service: Gynecology;  Laterality: Bilateral;   LYSIS OF  ADHESION N/A 01/29/2018   Procedure: LYSIS OF ADHESION;  Surgeon: Thurnell Lose, MD;  Location: [facility];  Service: Gynecology;  Laterality: N/A;   TOOTH EXTRACTION     general anesthesia   TUBAL LIGATION     Social History   Occupational History   Not on file  Tobacco Use   Smoking status: Never   Smokeless tobacco: Never  Vaping Use   Vaping Use: Never used  Substance and Sexual Activity   Alcohol use: Yes    Alcohol/week: 2.0 - 3.0 standard drinks of alcohol    Types: 2 - 3 Glasses of wine per week   Drug use: No   Sexual activity: Yes    Birth control/protection: Surgical

## 2021-12-24 NOTE — Progress Notes (Unsigned)
[place] at [facility] [address] [place], [place] [address] [phone]  Date:  12/28/2021   Name:  Cheyenna Pankowski   DOB:  11-13-1970   MRN:  [id number]  PCP:  Darreld Mclean, MD    Chief Complaint: No chief complaint on file.   History of Present Illness:  Aislin Onofre is a 51 y.o. 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   Cervical disc disorder with radiculopathy of cervical region 08/06/2016    Past Medical History:  Diagnosis Date   Anemia    PONV (postoperative nausea and vomiting)     Past Surgical History:  Procedure Laterality Date   ABDOMINAL HYSTERECTOMY     CESAREAN SECTION     x 3   CYSTOSCOPY N/A 01/29/2018   Procedure: CYSTOSCOPY;  Surgeon: Thurnell Lose, MD;  Location: [facility];  Service: Gynecology;  Laterality: N/A;   HYSTERECTOMY ABDOMINAL WITH SALPINGECTOMY Bilateral 01/29/2018   Procedure: HYSTERECTOMY ABDOMINAL WITH SALPINGECTOMY;  Surgeon: Thurnell Lose, MD;  Location: [facility];  Service: Gynecology;  Laterality: Bilateral;   LYSIS OF ADHESION N/A 01/29/2018   Procedure: LYSIS OF ADHESION;  Surgeon: Thurnell Lose, MD;  Location: [facility];  Service: Gynecology;  Laterality: N/A;   TOOTH EXTRACTION     general anesthesia   TUBAL LIGATION      Social History   Tobacco Use   Smoking status: Never   Smokeless tobacco: Never  Vaping Use   Vaping Use: Never used  Substance Use Topics   Alcohol use: Yes    Alcohol/week: 2.0 -  3.0 standard drinks of alcohol    Types: 2 - 3 Glasses of wine per week   Drug use: No    No family history on file.  No Known Allergies  Medication list has been reviewed and updated.  Current Outpatient Medications on File Prior to Visit  Medication Sig Dispense Refill   celecoxib (CELEBREX) 200 MG capsule Take 1 capsule (200 mg total) by mouth 2 (two) times daily between meals as needed. 60 capsule 1   methocarbamol (ROBAXIN) 500 MG tablet Take 1 tablet (500 mg total) by mouth every 8 (eight) hours as needed for muscle spasms. 30 tablet 3   No current facility-administered medications on file prior to visit.    Review of Systems:  As per HPI- otherwise negative.   Physical Examination: There were no vitals filed for this visit. There were no vitals filed for this visit. There is no height or weight on file to calculate BMI. Ideal Body Weight:    GEN: no acute distress. HEENT: Atraumatic, Normocephalic.  Ears and Nose: No external deformity. CV: RRR, No M/G/R. No JVD. No thrill. No extra heart sounds. PULM: CTA B, no wheezes, crackles, rhonchi. No retractions. No resp. distress. No accessory muscle use. ABD: S, NT, ND, +BS. No rebound. No HSM. EXTR: No c/c/e PSYCH: Normally interactive. Conversant.    Assessment and Plan: ***  Signed Lamar Blinks, MD

## 2021-12-28 ENCOUNTER — Telehealth: Payer: BC Managed Care – PPO | Admitting: Family Medicine

## 2021-12-28 DIAGNOSIS — Z1231 Encounter for screening mammogram for malignant neoplasm of breast: Secondary | ICD-10-CM | POA: Diagnosis not present

## 2021-12-28 DIAGNOSIS — M25552 Pain in left hip: Secondary | ICD-10-CM | POA: Diagnosis not present

## 2021-12-28 DIAGNOSIS — R7303 Prediabetes: Secondary | ICD-10-CM

## 2021-12-28 DIAGNOSIS — G8929 Other chronic pain: Secondary | ICD-10-CM

## 2021-12-28 DIAGNOSIS — M25562 Pain in left knee: Secondary | ICD-10-CM

## 2022-01-29 ENCOUNTER — Other Ambulatory Visit: Payer: Self-pay | Admitting: Family Medicine

## 2022-01-29 DIAGNOSIS — Z1231 Encounter for screening mammogram for malignant neoplasm of breast: Secondary | ICD-10-CM

## 2022-01-31 DIAGNOSIS — Z1231 Encounter for screening mammogram for malignant neoplasm of breast: Secondary | ICD-10-CM

## 2022-06-28 ENCOUNTER — Other Ambulatory Visit: Payer: Self-pay | Admitting: Family Medicine

## 2022-06-28 DIAGNOSIS — Z1231 Encounter for screening mammogram for malignant neoplasm of breast: Secondary | ICD-10-CM

## 2022-06-30 NOTE — Progress Notes (Deleted)
[facility] at [facility] [address] [place], [place] [address] [phone]  Date:  07/02/2022   Name:  [name]   DOB:  20-Sep-1970   MRN:  [id number]  PCP:  Pearline Cables, MD    Chief Complaint: No chief complaint on file.   History of Present Illness:  [name] is a 52 y.o. 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/2018   Cervical disc disorder with radiculopathy of cervical region 08/06/2016    Past Medical History:  Diagnosis Date   Anemia    PONV (postoperative nausea and vomiting)     Past Surgical History:  Procedure Laterality Date   ABDOMINAL HYSTERECTOMY     CESAREAN SECTION     x 3   CYSTOSCOPY N/A 01/29/2018   Procedure: CYSTOSCOPY;  Surgeon: Geryl Rankins, MD;  Location: [facility];  Service: Gynecology;  Laterality: N/A;   HYSTERECTOMY ABDOMINAL WITH SALPINGECTOMY Bilateral 01/29/2018   Procedure: HYSTERECTOMY ABDOMINAL WITH SALPINGECTOMY;  Surgeon: Geryl Rankins, MD;  Location: [facility];  Service: Gynecology;  Laterality: Bilateral;   LYSIS OF ADHESION N/A 01/29/2018   Procedure: LYSIS OF ADHESION;  Surgeon: Geryl Rankins, MD;  Location: [facility];  Service: Gynecology;  Laterality: N/A;   TOOTH EXTRACTION     general anesthesia   TUBAL LIGATION      Social History   Tobacco Use   Smoking status: Never   Smokeless tobacco: Never  Vaping Use   Vaping Use: Never used  Substance Use Topics   Alcohol use: Yes    Alcohol/week: 2.0 - 3.0 standard drinks of alcohol    Types: 2 - 3 Glasses of  wine per week   Drug use: No    No family history on file.  No Known Allergies  Medication list has been reviewed and updated.  Current Outpatient Medications on File Prior to Visit  Medication Sig Dispense Refill   celecoxib (CELEBREX) 200 MG capsule Take 1 capsule (200 mg total) by mouth 2 (two) times daily between meals as needed. 60 capsule 1   methocarbamol (ROBAXIN) 500 MG tablet Take 1 tablet (500 mg total) by mouth every 8 (eight) hours as needed for muscle spasms. 30 tablet 3   No current facility-administered medications on file prior to visit.    Review of Systems:  As per HPI- otherwise negative.   Physical Examination: There were no vitals filed for this visit. There were no vitals filed for this visit. There is no height or weight on file to calculate BMI. Ideal Body Weight:    GEN: no acute distress. HEENT: Atraumatic, Normocephalic.  Ears and Nose: No external deformity. CV: RRR, No M/G/R. No JVD. No thrill. No extra heart sounds. PULM: CTA B, no wheezes, crackles, rhonchi. No retractions. No resp. distress. No accessory muscle use. ABD: S, NT, ND, +BS. No rebound. No HSM. EXTR: No c/c/e PSYCH: Normally interactive. Conversant.    Assessment and Plan: ***  Signed Abbe Amsterdam, MD

## 2022-07-02 ENCOUNTER — Ambulatory Visit: Payer: BC Managed Care – PPO | Admitting: Family Medicine

## 2022-07-02 DIAGNOSIS — Z130 Encounter for screening for diseases of the blood and blood-forming organs and certain disorders involving the immune mechanism: Secondary | ICD-10-CM

## 2022-07-02 DIAGNOSIS — Z13220 Encounter for screening for lipoid disorders: Secondary | ICD-10-CM

## 2022-07-02 DIAGNOSIS — R5383 Other fatigue: Secondary | ICD-10-CM

## 2022-07-02 DIAGNOSIS — R7303 Prediabetes: Secondary | ICD-10-CM

## 2022-07-13 DIAGNOSIS — Z1231 Encounter for screening mammogram for malignant neoplasm of breast: Secondary | ICD-10-CM

## 2022-09-04 ENCOUNTER — Ambulatory Visit
Admission: RE | Admit: 2022-09-04 | Discharge: 2022-09-04 | Disposition: A | Discharge status: Home / Self Care | Payer: BC Managed Care – PPO | Source: Ambulatory Visit | Attending: Family Medicine | Admitting: Family Medicine

## 2022-09-04 DIAGNOSIS — Z1231 Encounter for screening mammogram for malignant neoplasm of breast: Secondary | ICD-10-CM

## 2022-09-07 ENCOUNTER — Other Ambulatory Visit: Payer: Self-pay | Admitting: Family Medicine

## 2022-09-07 DIAGNOSIS — R928 Other abnormal and inconclusive findings on diagnostic imaging of breast: Secondary | ICD-10-CM

## 2022-09-14 ENCOUNTER — Ambulatory Visit: Admission: RE | Admit: 2022-09-14 | Payer: BC Managed Care – PPO | Source: Ambulatory Visit

## 2022-09-14 ENCOUNTER — Ambulatory Visit
Admission: RE | Admit: 2022-09-14 | Discharge: 2022-09-14 | Disposition: A | Discharge status: Home / Self Care | Payer: BC Managed Care – PPO | Source: Ambulatory Visit | Attending: Family Medicine | Admitting: Family Medicine

## 2022-09-14 DIAGNOSIS — R928 Other abnormal and inconclusive findings on diagnostic imaging of breast: Secondary | ICD-10-CM

## 2024-03-09 ENCOUNTER — Encounter: Payer: Self-pay | Admitting: Family Medicine

## 2024-03-10 ENCOUNTER — Telehealth: Admitting: Physician Assistant

## 2024-03-10 DIAGNOSIS — B9689 Other specified bacterial agents as the cause of diseases classified elsewhere: Secondary | ICD-10-CM

## 2024-03-10 DIAGNOSIS — J019 Acute sinusitis, unspecified: Secondary | ICD-10-CM

## 2024-03-10 DIAGNOSIS — H6502 Acute serous otitis media, left ear: Secondary | ICD-10-CM

## 2024-03-10 MED ORDER — AMOXICILLIN-POT CLAVULANATE 875-125 MG PO TABS: 1.0000 | ORAL_TABLET | Freq: Two times a day (BID) | ORAL | 0 refills | Status: AC

## 2024-03-10 MED ORDER — CIPROFLOXACIN-DEXAMETHASONE 0.3-0.1 % OT SUSP: 4.0000 [drp] | Freq: Two times a day (BID) | OTIC | 0 refills | Status: AC

## 2024-03-10 NOTE — Patient Instructions (Signed)
 " [name], thank you for joining Delon CHRISTELLA Dickinson, PA-C for today's virtual visit.  While this provider is not your primary care provider (PCP), if your PCP is located in our provider database this encounter information will be shared with them immediately following your visit.   A [facility] MyChart account gives you access to today's visit and all your visits, tests, and labs performed at [facility]  click here if you don't have a [facility] MyChart account or go to [email or web address]  Consent: (Patient) [name] provided verbal consent for this virtual visit at the beginning of the encounter.  Current Medications:  Current Outpatient Medications:    amoxicillin -clavulanate (AUGMENTIN ) 875-125 MG tablet, Take 1 tablet by mouth 2 (two) times daily., Disp: 20 tablet, Rfl: 0   ciprofloxacin -dexamethasone  (CIPRODEX ) OTIC suspension, Place 4 drops into the left ear 2 (two) times daily for 7 days., Disp: 7.5 mL, Rfl: 0   celecoxib  (CELEBREX ) 200 MG capsule, Take 1 capsule (200 mg total) by mouth 2 (two) times daily between meals as needed., Disp: 60 capsule, Rfl: 1   methocarbamol  (ROBAXIN ) 500 MG tablet, Take 1 tablet (500 mg total) by mouth every 8 (eight) hours as needed for muscle spasms., Disp: 30 tablet, Rfl: 3   Medications ordered in this encounter:  Meds ordered this encounter  Medications   amoxicillin -clavulanate (AUGMENTIN ) 875-125 MG tablet    Sig: Take 1 tablet by mouth 2 (two) times daily.    Dispense:  20 tablet    Refill:  0    Supervising Provider:   LAMPTEY, PHILIP O [[id number]]   ciprofloxacin -dexamethasone  (CIPRODEX ) OTIC suspension    Sig: Place 4 drops into the left ear 2 (two) times daily for 7 days.    Dispense:  7.5 mL    Refill:  0    Supervising Provider:   BLAISE ALEENE KIDD [[id number]]     *If you need refills on other medications prior to your next appointment, please contact your pharmacy*  Follow-Up: Call  back or seek an in-person evaluation if the symptoms worsen or if the condition fails to improve as anticipated.  [facility] [phone]  Other Instructions Otitis Media, Adult  Otitis media occurs when there is inflammation and fluid in the middle ear with signs and symptoms of an acute infection. The middle ear is a part of the ear that contains bones for hearing as well as air that helps send sounds to the brain. When infected fluid builds up in this space, it causes pressure and can lead to an ear infection. The eustachian tube connects the middle ear to the back of the nose (nasopharynx) and normally allows air into the middle ear. If the eustachian tube becomes blocked, fluid can build up and become infected. What are the causes? This condition is caused by a blockage in the eustachian tube. This can be caused by mucus or by swelling of the tube. Problems that can cause a blockage include: A cold or other upper respiratory infection. Allergies. An irritant, such as tobacco smoke. Enlarged adenoids. The adenoids are areas of soft tissue located high in the back of the throat, behind the nose and the roof of the mouth. They are part of the body's defense system (immune system). A mass in the nasopharynx. Damage to the ear caused by pressure changes (barotrauma). What increases the risk? You are more likely to develop this condition if you: Smoke or are exposed to tobacco smoke. Have  an opening in the roof of your mouth (cleft palate). Have gastroesophageal reflux. Have an immune system disorder. What are the signs or symptoms? Symptoms of this condition include: Ear pain. Fever. Decreased hearing. Tiredness (lethargy). Fluid leaking from the ear, if the eardrum is ruptured or has burst. Ringing in the ear. How is this diagnosed?  This condition is diagnosed with a physical exam. During the exam, your health care provider will use an instrument called an otoscope  to look in your ear and check for redness, swelling, and fluid. He or she will also ask about your symptoms. Your health care provider may also order tests, such as: A pneumatic otoscopy. This is a test to check the movement of the eardrum. It is done by squeezing a small amount of air into the ear. A tympanogram. This is a test that shows how well the eardrum moves in response to air pressure in the ear canal. It provides a graph for your health care provider to review. How is this treated? This condition can go away on its own within 3-5 days. But if the condition is caused by a bacterial infection and does not go away on its own, or if it keeps coming back, your health care provider may: Prescribe antibiotic medicine to treat the infection. Prescribe or recommend medicines to control pain. Follow these instructions at home: Take over-the-counter and prescription medicines only as told by your health care provider. If you were prescribed an antibiotic medicine, take it as told by your health care provider. Do not stop taking the antibiotic even if you start to feel better. Keep all follow-up visits. This is important. Contact a health care provider if: You have bleeding from your nose. There is a lump on your neck. You are not feeling better in 5 days. You feel worse instead of better. Get help right away if: You have severe pain that is not controlled with medicine. You have swelling, redness, or pain around your ear. You have stiffness in your neck. A part of your face is not moving (paralyzed). The bone behind your ear (mastoid bone) is tender when you touch it. You develop a severe headache. Summary Otitis media is redness, soreness, and swelling of the middle ear, usually resulting in pain and decreased hearing. This condition can go away on its own within 3-5 days. If the problem does not go away in 3-5 days, your health care provider may give you medicines to treat the  infection. If you were prescribed an antibiotic medicine, take it as told by your health care provider. Follow all instructions that were given to you by your health care provider. This information is not intended to replace advice given to you by your health care provider. Make sure you discuss any questions you have with your health care provider. Document Revised: 05/09/2020 Document Reviewed: 05/09/2020 Elsevier Patient Education  2024 Elsevier Inc.   If you have been instructed to have an in-person evaluation today at a local Urgent Care facility, please use the link below. It will take you to a list of all of our available [facility], including address, phone number and hours of operation. Please do not delay care.  [facility]  If you or a family member do not have a primary care provider, use the link below to schedule a visit and establish care. When you choose a [facility] primary care physician or advanced practice provider, you gain a long-term partner in  health. Find a Primary Care Provider  Learn more about [facility] in-office and virtual care options: [facility] - Get Care Now "

## 2024-03-10 NOTE — Progress Notes (Signed)
 " Virtual Visit Consent   [name], you are scheduled for a virtual visit with a [facility] provider today. Just as with appointments in the office, your consent must be obtained to participate. Your consent will be active for this visit and any virtual visit you may have with one of our providers in the next 365 days. If you have a MyChart account, a copy of this consent can be sent to you electronically.  As this is a virtual visit, video technology does not allow for your provider to perform a traditional examination. This may limit your provider's ability to fully assess your condition. If your provider identifies any concerns that need to be evaluated in person or the need to arrange testing (such as labs, EKG, etc.), we will make arrangements to do so. Although advances in technology are sophisticated, we cannot ensure that it will always work on either your end or our end. If the connection with a video visit is poor, the visit may have to be switched to a telephone visit. With either a video or telephone visit, we are not always able to ensure that we have a secure connection.  By engaging in this virtual visit, you consent to the provision of healthcare and authorize for your insurance to be billed (if applicable) for the services provided during this visit. Depending on your insurance coverage, you may receive a charge related to this service.  I need to obtain your verbal consent now. Are you willing to proceed with your visit today? Kay Shippy has provided verbal consent on 03/10/2024 for a virtual visit (video or telephone). Delon CHRISTELLA Dickinson, PA-C  Date: 03/10/2024 11:41 AM   Virtual Visit via Video Note   I, Delon CHRISTELLA Dickinson, connected with  [name]  ([id number], 24-Apr-1970) on 03/10/24 at 11:30 AM EST by a video-enabled telemedicine application and verified that I am speaking with the correct person using two identifiers.  Location: Patient:  Virtual Visit Location Patient: Home Provider: Virtual Visit Location Provider: Home Office   I discussed the limitations of evaluation and management by telemedicine and the availability of in person appointments. The patient expressed understanding and agreed to proceed.    History of Present Illness: [name] is a 54 y.o. who identifies as a female who was assigned female at birth, and is being seen today for sinus congestion and left ear pain.  HPI: Sinusitis This is a new problem. The current episode started 1 to 4 weeks ago (Sinus congestion over a week, worsened acutely today with left ear pain). The problem has been gradually worsening since onset. Maximum temperature: subjective on Friday and Saturday. The fever has been present for 1 to 2 days. The pain is severe. Associated symptoms include congestion, coughing (yesterday, more from post nasal drainage), ear pain (left; acutely worsened this morning), headaches, sinus pressure (left) and a sore throat (improving). Pertinent negatives include no chills, diaphoresis, hoarse voice or neck pain. Treatments tried: generic multi-symptom, mucinex. The treatment provided no relief.    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  Resting comfortably at home.  Head is normocephalic, atraumatic.  No labored breathing.  Speech is  clear and coherent with logical content.  Patient is alert and oriented at baseline.    Assessment and Plan: 1. Acute bacterial sinusitis (Primary) - amoxicillin -clavulanate (AUGMENTIN ) 875-125 MG tablet; Take 1 tablet by mouth 2 (two) times daily.   Dispense: 20 tablet; Refill: 0  2. Non-recurrent acute serous otitis media of left ear - amoxicillin -clavulanate (AUGMENTIN ) 875-125 MG tablet; Take 1 tablet by mouth 2 (two) times daily.  Dispense: 20 tablet; Refill: 0 - ciprofloxacin -dexamethasone  (CIPRODEX ) OTIC suspension; Place 4 drops into the left ear 2 (two) times daily for 7 days.  Dispense: 7.5 mL; Refill: 0  - Worsening symptoms that have not responded to OTC medications.  - Will give Augmentin  and Ciprodex  ear drops - Continue saline nasal rinses - Could consider to add Flonase (Fluticasone) nasal spray over the counter for possible eustachian tube dysfunction - Steam and humidifier can help - Warm compress to ear - Stay well hydrated and get plenty of rest.  - Seek in person evaluation if no symptom improvement or if symptoms worsen   Follow Up Instructions: I discussed the assessment and treatment plan with the patient. The patient was provided an opportunity to ask questions and all were answered. The patient agreed with the plan and demonstrated an understanding of the instructions.  A copy of instructions were sent to the patient via MyChart unless otherwise noted below.    The patient was advised to call back or seek an in-person evaluation if the symptoms worsen or if the condition fails to improve as anticipated.    Delon HERO Ranyah Groeneveld, PA-C     [1] No Known Allergies [2]  Current Outpatient Medications:    amoxicillin -clavulanate (AUGMENTIN ) 875-125 MG tablet, Take 1 tablet by mouth 2 (two) times daily., Disp: 20 tablet, Rfl: 0   ciprofloxacin -dexamethasone  (CIPRODEX ) OTIC suspension, Place 4 drops into the left ear 2 (two) times daily for 7 days., Disp: 7.5 mL, Rfl: 0   celecoxib  (CELEBREX ) 200 MG capsule, Take 1 capsule (200 mg total) by mouth 2 (two) times daily between meals as needed., Disp: 60 capsule, Rfl: 1   methocarbamol  (ROBAXIN ) 500 MG tablet, Take 1 tablet (500 mg total) by mouth every 8 (eight)  hours as needed for muscle spasms., Disp: 30 tablet, Rfl: 3  "

## 2024-04-01 ENCOUNTER — Encounter: Payer: Self-pay | Admitting: Family Medicine
